# Patient Record
Sex: Female | Born: 1955 | Hispanic: Yes | Marital: Married | Smoking: Never smoker
Health system: Southern US, Community
[De-identification: ages and names within clinical notes are randomized; demographics above are authoritative.]

## PROBLEM LIST (undated history)

## (undated) DIAGNOSIS — Z8673 Personal history of transient ischemic attack (TIA), and cerebral infarction without residual deficits: Secondary | ICD-10-CM

## (undated) DIAGNOSIS — K7460 Unspecified cirrhosis of liver: Secondary | ICD-10-CM

## (undated) DIAGNOSIS — M1990 Unspecified osteoarthritis, unspecified site: Secondary | ICD-10-CM

## (undated) DIAGNOSIS — R161 Splenomegaly, not elsewhere classified: Secondary | ICD-10-CM

## (undated) HISTORY — DX: Unspecified osteoarthritis, unspecified site: M19.90

## (undated) HISTORY — PX: ABDOMINAL HYSTERECTOMY: SHX81

## (undated) HISTORY — DX: Unspecified cirrhosis of liver: K74.60

## (undated) HISTORY — DX: Personal history of transient ischemic attack (TIA), and cerebral infarction without residual deficits: Z86.73

## (undated) HISTORY — PX: APPENDECTOMY: SHX54

## (undated) HISTORY — DX: Splenomegaly, not elsewhere classified: R16.1

## (undated) HISTORY — PX: EXTERNAL EAR SURGERY: SHX627

---

## 2014-04-08 ENCOUNTER — Inpatient Hospital Stay (HOSPITAL_COMMUNITY)
Admission: EM | Admit: 2014-04-08 | Discharge: 2014-04-11 | DRG: 872 | Disposition: A | Discharge status: Home / Self Care | Payer: Self-pay | Attending: Internal Medicine | Admitting: Internal Medicine

## 2014-04-08 ENCOUNTER — Encounter (HOSPITAL_COMMUNITY): Payer: Self-pay | Admitting: Emergency Medicine

## 2014-04-08 ENCOUNTER — Emergency Department (HOSPITAL_COMMUNITY): Payer: PRIVATE HEALTH INSURANCE

## 2014-04-08 DIAGNOSIS — R509 Fever, unspecified: Secondary | ICD-10-CM | POA: Diagnosis present

## 2014-04-08 DIAGNOSIS — R748 Abnormal levels of other serum enzymes: Secondary | ICD-10-CM | POA: Diagnosis present

## 2014-04-08 DIAGNOSIS — A419 Sepsis, unspecified organism: Principal | ICD-10-CM | POA: Diagnosis present

## 2014-04-08 DIAGNOSIS — I69998 Other sequelae following unspecified cerebrovascular disease: Secondary | ICD-10-CM

## 2014-04-08 DIAGNOSIS — R51 Headache: Secondary | ICD-10-CM | POA: Diagnosis present

## 2014-04-08 DIAGNOSIS — N6320 Unspecified lump in the left breast, unspecified quadrant: Secondary | ICD-10-CM

## 2014-04-08 DIAGNOSIS — N63 Unspecified lump in unspecified breast: Secondary | ICD-10-CM | POA: Diagnosis present

## 2014-04-08 DIAGNOSIS — D696 Thrombocytopenia, unspecified: Secondary | ICD-10-CM | POA: Diagnosis present

## 2014-04-08 DIAGNOSIS — G8190 Hemiplegia, unspecified affecting unspecified side: Secondary | ICD-10-CM | POA: Diagnosis present

## 2014-04-08 DIAGNOSIS — K7460 Unspecified cirrhosis of liver: Secondary | ICD-10-CM | POA: Diagnosis present

## 2014-04-08 DIAGNOSIS — N10 Acute tubulo-interstitial nephritis: Secondary | ICD-10-CM | POA: Diagnosis present

## 2014-04-08 DIAGNOSIS — Z91013 Allergy to seafood: Secondary | ICD-10-CM

## 2014-04-08 DIAGNOSIS — R519 Headache, unspecified: Secondary | ICD-10-CM | POA: Diagnosis present

## 2014-04-08 DIAGNOSIS — Z6841 Body Mass Index (BMI) 40.0 and over, adult: Secondary | ICD-10-CM

## 2014-04-08 LAB — URINALYSIS, ROUTINE W REFLEX MICROSCOPIC
Bilirubin Urine: NEGATIVE
Glucose, UA: NEGATIVE mg/dL
Ketones, ur: NEGATIVE mg/dL
Leukocytes, UA: NEGATIVE
NITRITE: NEGATIVE
SPECIFIC GRAVITY, URINE: 1.01 (ref 1.005–1.030)
Urobilinogen, UA: 2 mg/dL — ABNORMAL HIGH (ref 0.0–1.0)
pH: 7.5 (ref 5.0–8.0)

## 2014-04-08 LAB — TROPONIN I

## 2014-04-08 LAB — CBC WITH DIFFERENTIAL/PLATELET
Basophils Absolute: 0 10*3/uL (ref 0.0–0.1)
Basophils Relative: 0 % (ref 0–1)
EOS ABS: 0.1 10*3/uL (ref 0.0–0.7)
Eosinophils Relative: 1 % (ref 0–5)
HEMATOCRIT: 39.2 % (ref 36.0–46.0)
HEMOGLOBIN: 13.4 g/dL (ref 12.0–15.0)
LYMPHS ABS: 1.2 10*3/uL (ref 0.7–4.0)
Lymphocytes Relative: 6 % — ABNORMAL LOW (ref 12–46)
MCH: 34.9 pg — AB (ref 26.0–34.0)
MCHC: 34.2 g/dL (ref 30.0–36.0)
MCV: 102.1 fL — ABNORMAL HIGH (ref 78.0–100.0)
MONOS PCT: 9 % (ref 3–12)
Monocytes Absolute: 1.7 10*3/uL — ABNORMAL HIGH (ref 0.1–1.0)
NEUTROS PCT: 84 % — AB (ref 43–77)
Neutro Abs: 16.4 10*3/uL — ABNORMAL HIGH (ref 1.7–7.7)
Platelets: 92 10*3/uL — ABNORMAL LOW (ref 150–400)
RBC: 3.84 MIL/uL — AB (ref 3.87–5.11)
RDW: 15 % (ref 11.5–15.5)
WBC: 19.4 10*3/uL — ABNORMAL HIGH (ref 4.0–10.5)

## 2014-04-08 LAB — COMPREHENSIVE METABOLIC PANEL
ALBUMIN: 2.2 g/dL — AB (ref 3.5–5.2)
ALT: 40 U/L — AB (ref 0–35)
AST: 45 U/L — ABNORMAL HIGH (ref 0–37)
Alkaline Phosphatase: 162 U/L — ABNORMAL HIGH (ref 39–117)
Anion gap: 11 (ref 5–15)
BILIRUBIN TOTAL: 1.3 mg/dL — AB (ref 0.3–1.2)
BUN: 13 mg/dL (ref 6–23)
CO2: 20 mEq/L (ref 19–32)
Calcium: 9.7 mg/dL (ref 8.4–10.5)
Chloride: 107 mEq/L (ref 96–112)
Creatinine, Ser: 0.69 mg/dL (ref 0.50–1.10)
GFR calc Af Amer: >90 mL/min (ref >90)
GFR calc non Af Amer: >90 mL/min (ref >90)
GLUCOSE: 95 mg/dL (ref 70–99)
POTASSIUM: 4.3 meq/L (ref 3.7–5.3)
SODIUM: 138 meq/L (ref 137–147)
TOTAL PROTEIN: 7.4 g/dL (ref 6.0–8.3)

## 2014-04-08 LAB — URINE MICROSCOPIC-ADD ON

## 2014-04-08 LAB — LACTIC ACID, PLASMA: Lactic Acid, Venous: 1.8 mmol/L (ref 0.5–2.2)

## 2014-04-08 LAB — PRO B NATRIURETIC PEPTIDE: Pro B Natriuretic peptide (BNP): 618.2 pg/mL — ABNORMAL HIGH (ref 0–125)

## 2014-04-08 MED ORDER — ACETAMINOPHEN 500 MG PO TABS
1000.0000 mg | ORAL_TABLET | Freq: Once | ORAL | Status: AC
  Administered 2014-04-08: 1000 mg via ORAL
  Filled 2014-04-08: qty 2

## 2014-04-08 MED ORDER — CEFTRIAXONE SODIUM 1 G IJ SOLR
1.0000 g | Freq: Once | INTRAMUSCULAR | Status: AC
  Administered 2014-04-08: 1 g via INTRAVENOUS
  Filled 2014-04-08: qty 10

## 2014-04-08 MED ORDER — SODIUM CHLORIDE 0.9 % IV SOLN
Freq: Once | INTRAVENOUS | Status: AC
  Administered 2014-04-08: 22:00:00 via INTRAVENOUS

## 2014-04-08 MED ORDER — AZITHROMYCIN 250 MG PO TABS
500.0000 mg | ORAL_TABLET | Freq: Once | ORAL | Status: AC
  Administered 2014-04-08: 500 mg via ORAL
  Filled 2014-04-08: qty 2

## 2014-04-08 NOTE — ED Provider Notes (Signed)
CSN: [id number]     Arrival date & time 04/08/14  2138 History  This chart was scribed for Maudry Diego, MD by Roxan Diesel, ED scribe.  This patient was seen in room APA11/APA11 and the patient's care was started at 9:54 PM.   Chief Complaint  Patient presents with  . Shortness of Breath    Patient is a 58 y.o. female presenting with shortness of breath. The history is provided by a relative. No language interpreter was used.  Shortness of Breath Severity:  Moderate Duration:  1 day Timing:  Constant Chronicity:  New Ineffective treatments:  None tried Associated symptoms: fever and headaches   Associated symptoms: no abdominal pain, no chest pain, no cough and no rash     HPI Comments: Dawn Beard is a 58 y.o. female who presents to the Emergency Department complaining of SOB that began earlier today.  Pt's son states that she has had a fever for the past 2 days and today has had difficulty breathing and "body shaking."  Currently she also complains of a headache.  Pt had a stroke 2 months ago in [place] with residual right-sided weakness.  However she is able to weak.  Family brought her here 1 week ago to receive medical care.  She has an appointment to establish care with a doctor later this month but does not currently have a PCP.    Past Medical History  Diagnosis Date  . Stroke     History reviewed. No pertinent past surgical history.  History reviewed. No pertinent family history.   History  Substance Use Topics  . Smoking status: Never Smoker   . Smokeless tobacco: Never Used  . Alcohol Use: Not on file    OB History   Grav Para Term Preterm Abortions TAB SAB Ect Mult Living                   Review of Systems  Constitutional: Positive for fever. Negative for appetite change and fatigue.  HENT: Negative for congestion, ear discharge and sinus pressure.   Eyes: Negative for discharge.  Respiratory: Positive for shortness of breath. Negative  for cough.   Cardiovascular: Negative for chest pain.  Gastrointestinal: Negative for abdominal pain and diarrhea.  Genitourinary: Negative for frequency and hematuria.  Musculoskeletal: Negative for back pain.  Skin: Negative for rash.  Neurological: Positive for tremors ("body shaking" per son) and headaches. Negative for seizures.  Psychiatric/Behavioral: Negative for hallucinations.      Allergies  Shrimp  Home Medications   Prior to Admission medications   Not on File   BP 140/96  Pulse 122  Temp(Src) 103 F (39.4 C) (Oral)  Resp 24  Ht 5\' 2"  (1.575 m)  Wt 220 lb 7.4 oz (100.001 kg)  BMI 40.31 kg/m2  SpO2 98%  Physical Exam  Nursing note and vitals reviewed. Constitutional: She is oriented to person, place, and time. She appears well-developed.  HENT:  Head: Normocephalic.  Eyes: Conjunctivae and EOM are normal. No scleral icterus.  Neck: Neck supple. No thyromegaly present.  Cardiovascular: Regular rhythm.  Tachycardia present.  Exam reveals no gallop and no friction rub.   No murmur heard. Pulmonary/Chest: No stridor. She has wheezes (bilaterally). She has no rales. She exhibits no tenderness.  Abdominal: She exhibits no distension. There is no tenderness. There is no rebound.  Musculoskeletal: Normal range of motion. She exhibits no edema.  Lymphadenopathy:    She has no cervical adenopathy.  Neurological:  She is oriented to person, place, and time.  Mild weakness to right upper and lower extremities  Skin: No rash noted. No erythema.  Psychiatric: She has a normal mood and affect. Her behavior is normal.    ED Course  Procedures (including critical care time)  DIAGNOSTIC STUDIES: Oxygen Saturation is 98% on room air, normal by my interpretation.    COORDINATION OF CARE: 9:57 PM-Discussed treatment plan which includes Tylenol, CXR, EKG, and labs with pt's family at bedside and they agreed to plan.     Labs Review Labs Reviewed  CULTURE, BLOOD  (ROUTINE X 2)  CULTURE, BLOOD (ROUTINE X 2)  CBC WITH DIFFERENTIAL  COMPREHENSIVE METABOLIC PANEL  LACTIC ACID, PLASMA  PRO B NATRIURETIC PEPTIDE  TROPONIN I  URINALYSIS, ROUTINE W REFLEX MICROSCOPIC    Imaging Review No results found.   EKG Interpretation None      MDM   Final diagnoses:  None   Admit for fever  The chart was scribed for me under my direct supervision.  I personally performed the history, physical, and medical decision making and all procedures in the evaluation of this patient.Maudry Diego, MD 04/08/14 [phone]

## 2014-04-08 NOTE — ED Notes (Signed)
Short of breath starting today, temp elevated x 2 days. Stroke 2 months ago with no residual effects. Does not speak english. Sons translating for her.

## 2014-04-09 ENCOUNTER — Inpatient Hospital Stay (HOSPITAL_COMMUNITY): Payer: PRIVATE HEALTH INSURANCE

## 2014-04-09 DIAGNOSIS — R519 Headache, unspecified: Secondary | ICD-10-CM | POA: Diagnosis present

## 2014-04-09 DIAGNOSIS — N10 Acute tubulo-interstitial nephritis: Secondary | ICD-10-CM

## 2014-04-09 DIAGNOSIS — R509 Fever, unspecified: Secondary | ICD-10-CM

## 2014-04-09 DIAGNOSIS — D696 Thrombocytopenia, unspecified: Secondary | ICD-10-CM

## 2014-04-09 DIAGNOSIS — A419 Sepsis, unspecified organism: Principal | ICD-10-CM

## 2014-04-09 DIAGNOSIS — R51 Headache: Secondary | ICD-10-CM

## 2014-04-09 DIAGNOSIS — K7460 Unspecified cirrhosis of liver: Secondary | ICD-10-CM

## 2014-04-09 DIAGNOSIS — N6320 Unspecified lump in the left breast, unspecified quadrant: Secondary | ICD-10-CM | POA: Diagnosis present

## 2014-04-09 LAB — COMPREHENSIVE METABOLIC PANEL
ALBUMIN: 1.6 g/dL — AB (ref 3.5–5.2)
ALT: 28 U/L (ref 0–35)
AST: 32 U/L (ref 0–37)
Alkaline Phosphatase: 125 U/L — ABNORMAL HIGH (ref 39–117)
Anion gap: 7 (ref 5–15)
BUN: 14 mg/dL (ref 6–23)
CHLORIDE: 114 meq/L — AB (ref 96–112)
CO2: 20 meq/L (ref 19–32)
CREATININE: 0.67 mg/dL (ref 0.50–1.10)
Calcium: 8.6 mg/dL (ref 8.4–10.5)
GFR calc Af Amer: >90 mL/min (ref >90)
Glucose, Bld: 133 mg/dL — ABNORMAL HIGH (ref 70–99)
Potassium: 4 mEq/L (ref 3.7–5.3)
SODIUM: 141 meq/L (ref 137–147)
Total Bilirubin: 1.2 mg/dL (ref 0.3–1.2)
Total Protein: 5.6 g/dL — ABNORMAL LOW (ref 6.0–8.3)

## 2014-04-09 LAB — CBC
HEMATOCRIT: 35.3 % — AB (ref 36.0–46.0)
Hemoglobin: 11.9 g/dL — ABNORMAL LOW (ref 12.0–15.0)
MCH: 35 pg — AB (ref 26.0–34.0)
MCHC: 33.7 g/dL (ref 30.0–36.0)
MCV: 103.8 fL — AB (ref 78.0–100.0)
Platelets: 87 10*3/uL — ABNORMAL LOW (ref 150–400)
RBC: 3.4 MIL/uL — ABNORMAL LOW (ref 3.87–5.11)
RDW: 15 % (ref 11.5–15.5)
WBC: 19 10*3/uL — AB (ref 4.0–10.5)

## 2014-04-09 LAB — HIV ANTIBODY (ROUTINE TESTING W REFLEX): HIV: NONREACTIVE

## 2014-04-09 LAB — PROTIME-INR
INR: 1.53 — ABNORMAL HIGH (ref 0.00–1.49)
Prothrombin Time: 18.4 seconds — ABNORMAL HIGH (ref 11.6–15.2)

## 2014-04-09 LAB — TROPONIN I
Troponin I: <0.3 ng/mL (ref <0.30)
Troponin I: <0.3 ng/mL (ref <0.30)

## 2014-04-09 LAB — VITAMIN B12: VITAMIN B 12: 1942 pg/mL — AB (ref 211–911)

## 2014-04-09 LAB — FOLATE: FOLATE: 7.1 ng/mL

## 2014-04-09 MED ORDER — VANCOMYCIN HCL IN DEXTROSE 1-5 GM/200ML-% IV SOLN
1000.0000 mg | Freq: Once | INTRAVENOUS | Status: AC
  Administered 2014-04-09: 1000 mg via INTRAVENOUS
  Filled 2014-04-09: qty 200

## 2014-04-09 MED ORDER — PIPERACILLIN-TAZOBACTAM 3.375 G IVPB
3.3750 g | Freq: Three times a day (TID) | INTRAVENOUS | Status: DC
  Administered 2014-04-09 – 2014-04-10 (×5): 3.375 g via INTRAVENOUS
  Filled 2014-04-09 (×6): qty 50

## 2014-04-09 MED ORDER — SODIUM CHLORIDE 0.9 % IV SOLN
INTRAVENOUS | Status: DC
  Administered 2014-04-09 – 2014-04-11 (×3): via INTRAVENOUS

## 2014-04-09 MED ORDER — ACETAMINOPHEN 650 MG RE SUPP: 650.0000 mg | Freq: Four times a day (QID) | RECTAL | Status: DC | PRN

## 2014-04-09 MED ORDER — IOHEXOL 300 MG/ML  SOLN
100.0000 mL | Freq: Once | INTRAMUSCULAR | Status: AC | PRN
  Administered 2014-04-09: 100 mL via INTRAVENOUS

## 2014-04-09 MED ORDER — PIPERACILLIN-TAZOBACTAM 3.375 G IVPB
INTRAVENOUS | Status: AC
  Filled 2014-04-09: qty 50

## 2014-04-09 MED ORDER — ONDANSETRON HCL 4 MG/2ML IJ SOLN: 4.0000 mg | Freq: Four times a day (QID) | INTRAMUSCULAR | Status: DC | PRN

## 2014-04-09 MED ORDER — ALBUTEROL SULFATE (2.5 MG/3ML) 0.083% IN NEBU: 2.5000 mg | INHALATION_SOLUTION | RESPIRATORY_TRACT | Status: DC | PRN

## 2014-04-09 MED ORDER — SODIUM CHLORIDE 0.9 % IJ SOLN
3.0000 mL | Freq: Two times a day (BID) | INTRAMUSCULAR | Status: DC
  Administered 2014-04-10: 3 mL via INTRAVENOUS

## 2014-04-09 MED ORDER — ONDANSETRON HCL 4 MG PO TABS: 4.0000 mg | ORAL_TABLET | Freq: Four times a day (QID) | ORAL | Status: DC | PRN

## 2014-04-09 MED ORDER — ACETAMINOPHEN 325 MG PO TABS
650.0000 mg | ORAL_TABLET | Freq: Four times a day (QID) | ORAL | Status: DC | PRN
  Administered 2014-04-09 – 2014-04-10 (×4): 650 mg via ORAL
  Filled 2014-04-09 (×4): qty 2

## 2014-04-09 MED ORDER — VANCOMYCIN HCL IN DEXTROSE 750-5 MG/150ML-% IV SOLN
750.0000 mg | Freq: Two times a day (BID) | INTRAVENOUS | Status: DC
  Administered 2014-04-09 – 2014-04-10 (×2): 750 mg via INTRAVENOUS
  Filled 2014-04-09 (×2): qty 150

## 2014-04-09 MED ORDER — VANCOMYCIN HCL IN DEXTROSE 1-5 GM/200ML-% IV SOLN
INTRAVENOUS | Status: AC
  Filled 2014-04-09: qty 200

## 2014-04-09 NOTE — Care Management Utilization Note (Signed)
UR completed 

## 2014-04-09 NOTE — Progress Notes (Signed)
[facility] PROGRESS NOTE  Dawn Beard UXN:[id number] DOB: 1956/03/21 DOA: 04/08/2014 PCP: No PCP Per Patient  Summary 58 yo woman from [place] 3 days ago with hx stroke (2 months ago) presents with persistent fever, flank pain, chills, and headache. Evaluation reveals pyelonephritis, cirrhosis and small left breast mass  Assessment/Plan: Sepsis secondary to pyelonephritis per CT. Some improvement this am. Her lactic acid is in normal range. Blood pressure slightly soft but tachycardia resolved. Await blood cultures. Continue IV fluids as well as Vanc and Zosy day #1. Monitor closely  Cirrhosis: denies hx of ETOH. INR 1.5. Elevated enzymes.  Will obtain Hep panel. May need GI consult depending on results of Hep panel. If not inpatient then certainly OP GI input.   Thrombocytopenia. Likely related to #2. Await B12 and folate. Home meds include supplements. No s/sx bleeding.   Headache. Resolved.    Elevated liver enzymes. See #2  Hx stroke: mild left sided weakness resolved. Not on aspirin likely due to #3.  Home meds include tegretol. Will continue. No hx seizure  Left breast mass: per CT. OP mamogram   Code Status: full Family Communication: son interpreting at bedside Disposition Plan: home with son   Consultants:  none  Procedures:  none  Antibiotics:  Vancomycin 04/09/14>>  Zosyn 04/09/14>>  HPI/Subjective: Awake. Reports some flank pain.   Objective: Filed Vitals:   04/09/14 0546  BP: 93/52  Pulse: 66  Temp: 98 F (36.7 C)  Resp: 15    Intake/Output Summary (Last 24 hours) at 04/09/14 1205 Last data filed at 04/09/14 0800  Gross per 24 hour  Intake      0 ml  Output      0 ml  Net      0 ml   Filed Weights   04/08/14 2139  Weight: 100.001 kg (220 lb 7.4 oz)    Exam:   General:  Obese calm comfortable  Cardiovascular: RRR No MGR No LE edema  Respiratory: normal effort BS distant but clear bilaterally no wheeze  Abdomen: obese  soft +BS non-tender to palpation  Musculoskeletal: no clubbing or cyanosis   Data Reviewed: Basic Metabolic Panel:  Recent Labs Lab 04/08/14 2220 04/09/14 0514  NA 138 141  K 4.3 4.0  CL 107 114*  CO2 20 20  GLUCOSE 95 133*  BUN 13 14  CREATININE 0.69 0.67  CALCIUM 9.7 8.6   Liver Function Tests:  Recent Labs Lab 04/08/14 2220 04/09/14 0514  AST 45* 32  ALT 40* 28  ALKPHOS 162* 125*  BILITOT 1.3* 1.2  PROT 7.4 5.6*  ALBUMIN 2.2* 1.6*   No results found for this basename: LIPASE, AMYLASE,  in the last 168 hours No results found for this basename: AMMONIA,  in the last 168 hours CBC:  Recent Labs Lab 04/08/14 2314 04/09/14 0514  WBC 19.4* 19.0*  NEUTROABS 16.4*  --   HGB 13.4 11.9*  HCT 39.2 35.3*  MCV 102.1* 103.8*  PLT 92* 87*   Cardiac Enzymes:  Recent Labs Lab 04/08/14 2220 04/09/14 0131 04/09/14 0649  TROPONINI <0.30 <0.30 <0.30   BNP (last 3 results)  Recent Labs  04/08/14 2220  PROBNP 618.2*   CBG: No results found for this basename: GLUCAP,  in the last 168 hours  Recent Results (from the past 240 hour(s))  CULTURE, BLOOD (ROUTINE X 2)     Status: None   Collection Time    04/08/14 10:19 PM      Result Value Ref  Range Status   Specimen Description RIGHT ANTECUBITAL   Final   Special Requests BOTTLES DRAWN AEROBIC ONLY 10CC   Final   Culture NO GROWTH 1 DAY   Final   Report Status PENDING   Incomplete  CULTURE, BLOOD (ROUTINE X 2)     Status: None   Collection Time    04/08/14 10:20 PM      Result Value Ref Range Status   Specimen Description BLOOD RIGHT ARM   Final   Special Requests BOTTLES DRAWN AEROBIC AND ANAEROBIC 8CC   Final   Culture NO GROWTH 1 DAY   Final   Report Status PENDING   Incomplete     Studies: Ct Chest W Contrast  04/09/2014   CLINICAL DATA:  Fever.  Sepsis of unclear etiology.  EXAM: CT CHEST, ABDOMEN, AND PELVIS WITH CONTRAST  TECHNIQUE: Multidetector CT imaging of the chest, abdomen and pelvis was  performed following the standard protocol during bolus administration of intravenous contrast.  CONTRAST:  132mL OMNIPAQUE IOHEXOL 300 MG/ML  SOLN  COMPARISON:  None.  FINDINGS: CT CHEST FINDINGS  Cardiac silhouette is borderline enlarged. Great vessels are normal in caliber. No mediastinal or hilar masses or enlarged lymph nodes. No neck base or axillary masses or adenopathy.  There is dependent subsegmental atelectasis most evident in the posterior lower lobes. No lung consolidation or edema. No lung nodule or mass. No effusion or pneumothorax.  There is a 15 mm relatively hyper attenuating mass in the left breast.  CT ABDOMEN AND PELVIS FINDINGS  There is an area of wedge-shaped hypoattenuation from the lower pole of the left kidney, which measures 3.5 cm x 3.2 cm in greatest transverse dimension. There is some perinephric stranding and thickening of the perirenal fascia. No discrete fluid collection is seen to suggest an abscess. Findings support pyelonephritis. No convincing right pyelonephritis. No renal mass. No hydronephrosis. Normal ureters. Unremarkable bladder.  Liver shows morphologic changes consistent with cirrhosis with subtle surface nodularity and central volume loss and relative enlargement of the caudate lobe. No liver mass or focal lesion is seen.  Spleen is enlarged measuring 20 cm by 13 cm x 6.9 cm. No splenic mass or focal lesions.  There are prominent venous collaterals that extend from the splenic hilum to the left renal vein.  There is a trace amount of ascites, noted adjacent to the liver.  Gallbladder and pancreas are unremarkable. No bile duct dilation. No adrenal masses. There are mildly enlarged gastrohepatic ligament lymph nodes, the largest measuring 13.5 mm in short axis. These are nonspecific but presumed reactive, and a common finding in the setting of cirrhosis.  Status post hysterectomy. No pelvic masses. Colon and small bowel are unremarkable. Appendix not visualized. No  abnormal fluid collections are seen to suggest an abscess. Small fat containing umbilical hernia also containing a small amount of fluid attenuation. No bowel.  There are degenerative changes throughout the visualized spine as well as an S-shaped scoliosis. No osteoblastic or osteolytic lesions.  IMPRESSION: 1. Findings consistent with pyelonephritis of the lower pole of the left kidney, likely the source of sepsis. No evidence of an abscess. No other findings to suggest a source of infection. 2. Cirrhosis with portal venous hypertension reflected by splenomegaly and splenorenal vascular collaterals. No liver mass or focal lesion. 3. Trace ascites. 4. Small left breast mass. Recommend correlation with mammography if mammography has not been recently performed. 5. Multiple other chronic findings as detailed.   Electronically Signed   By:  Lajean Manes M.D.   On: 04/09/2014 11:24   Ct Abdomen Pelvis W Contrast  04/09/2014   CLINICAL DATA:  Fever.  Sepsis of unclear etiology.  EXAM: CT CHEST, ABDOMEN, AND PELVIS WITH CONTRAST  TECHNIQUE: Multidetector CT imaging of the chest, abdomen and pelvis was performed following the standard protocol during bolus administration of intravenous contrast.  CONTRAST:  134mL OMNIPAQUE IOHEXOL 300 MG/ML  SOLN  COMPARISON:  None.  FINDINGS: CT CHEST FINDINGS  Cardiac silhouette is borderline enlarged. Great vessels are normal in caliber. No mediastinal or hilar masses or enlarged lymph nodes. No neck base or axillary masses or adenopathy.  There is dependent subsegmental atelectasis most evident in the posterior lower lobes. No lung consolidation or edema. No lung nodule or mass. No effusion or pneumothorax.  There is a 15 mm relatively hyper attenuating mass in the left breast.  CT ABDOMEN AND PELVIS FINDINGS  There is an area of wedge-shaped hypoattenuation from the lower pole of the left kidney, which measures 3.5 cm x 3.2 cm in greatest transverse dimension. There is some  perinephric stranding and thickening of the perirenal fascia. No discrete fluid collection is seen to suggest an abscess. Findings support pyelonephritis. No convincing right pyelonephritis. No renal mass. No hydronephrosis. Normal ureters. Unremarkable bladder.  Liver shows morphologic changes consistent with cirrhosis with subtle surface nodularity and central volume loss and relative enlargement of the caudate lobe. No liver mass or focal lesion is seen.  Spleen is enlarged measuring 20 cm by 13 cm x 6.9 cm. No splenic mass or focal lesions.  There are prominent venous collaterals that extend from the splenic hilum to the left renal vein.  There is a trace amount of ascites, noted adjacent to the liver.  Gallbladder and pancreas are unremarkable. No bile duct dilation. No adrenal masses. There are mildly enlarged gastrohepatic ligament lymph nodes, the largest measuring 13.5 mm in short axis. These are nonspecific but presumed reactive, and a common finding in the setting of cirrhosis.  Status post hysterectomy. No pelvic masses. Colon and small bowel are unremarkable. Appendix not visualized. No abnormal fluid collections are seen to suggest an abscess. Small fat containing umbilical hernia also containing a small amount of fluid attenuation. No bowel.  There are degenerative changes throughout the visualized spine as well as an S-shaped scoliosis. No osteoblastic or osteolytic lesions.  IMPRESSION: 1. Findings consistent with pyelonephritis of the lower pole of the left kidney, likely the source of sepsis. No evidence of an abscess. No other findings to suggest a source of infection. 2. Cirrhosis with portal venous hypertension reflected by splenomegaly and splenorenal vascular collaterals. No liver mass or focal lesion. 3. Trace ascites. 4. Small left breast mass. Recommend correlation with mammography if mammography has not been recently performed. 5. Multiple other chronic findings as detailed.    Electronically Signed   By: Lajean Manes M.D.   On: 04/09/2014 11:24   Dg Chest Portable 1 View  04/08/2014   CLINICAL DATA:  Shortness of Breath  EXAM: PORTABLE CHEST - 1 VIEW  COMPARISON:  None.  FINDINGS: Lungs are clear. Heart is upper normal in size with normal pulmonary vascularity. No adenopathy. No bone lesions.  IMPRESSION: No edema or consolidation.   Electronically Signed   By: Lowella Grip M.D.   On: 04/08/2014 22:43    Scheduled Meds: . piperacillin-tazobactam (ZOSYN)  IV  3.375 g Intravenous Q8H  . sodium chloride  3 mL Intravenous Q12H  . vancomycin  750 mg Intravenous Q12H   Continuous Infusions: . sodium chloride 100 mL/hr at 04/09/14 4665    Principal Problem:   Sepsis Active Problems:   Fever   Thrombocytopenia   Headache   Morbid obesity   Acute pyelonephritis   Cirrhosis   Breast mass, left    Time spent: 35 minutes    [facility] Pager [phone]. If 7PM-7AM, please contact night-coverage at [email or web address], password [facility] 04/09/2014, 12:05 PM  LOS: 1 day

## 2014-04-09 NOTE — H&P (Signed)
[facility] History and Physical  Dawn Beard QMV:[id number] DOB: 05/23/1956 DOA: 04/08/2014  Referring physician: Dr. Roderic Palau PCP: No PCP Per Patient   Chief Complaint: fever  HPI: Dawn Beard is a 58 y.o. female who recently moved to the [place] from [place] approximately 3-4 days ago. The patient does not speak any [place]. Her sons are translating during my visit. Patient is somewhat of a poor historian. Initially, he was reported the patient was having fever, shortness of breath and headache for the past 3 days. On further questioning, her son reports that she has not been feeling well for several weeks now. She usually has fevers during the evening/night. She has associated headache in her temporal regions bilaterally. No recent changes in vision. She's not had any diarrhea her on antibiotics. She's not had any dysuria. She's had no cough, hemoptysis, weight loss. No chest pain. She denies any abdominal pain or vomiting. She does not have any sore throat. She denies any neck stiffness/pain. Her family reports that she usually does well during the day and feels poorly at night. They report that she had seen several doctors in [place] without any definitive diagnosis and therefore her family brought her to the [place] for further treatment. There's been no development of recent rashes. Her family reports that she did have a stroke approximately 2 months ago which resulted in some left sided hemiparesis. Her neurologic deficits have improved since onset. The patient is able to stand and cook without difficulty. She does not smoke or consume alcohol. Workup in the emergency room his unremarkable. Urinalysis is unrevealing as is chest x-ray. She is noted to have a high fever 103.4 with a significant leukocytosis and thrombocytopenia. Liver enzymes are mildly elevated. The patient will be admitted for further workup.   Review of Systems:  Limited due to language  barrier. Pertinent positives as per HPI, otherwise negative  Past Medical History  Diagnosis Date  . Stroke    History reviewed. No pertinent past surgical history. Social History:  reports that she has never smoked. She has never used smokeless tobacco. Her alcohol and drug histories are not on file.  Allergies  Allergen Reactions  . Shrimp [Shellfish Allergy]     History reviewed. No pertinent family history.   Prior to Admission medications   Not on File   Physical Exam: Filed Vitals:   04/08/14 2347  BP:   Pulse:   Temp: 101.3 F (38.5 C)  Resp:     BP 110/63  Pulse 107  Temp(Src) 101.3 F (38.5 C) (Oral)  Resp 27  Ht 5\' 2"  (1.575 m)  Wt 100.001 kg (220 lb 7.4 oz)  BMI 40.31 kg/m2  SpO2 98%  General:  Appears calm and comfortable Eyes: PERRL, normal lids, irises & conjunctiva ENT: grossly normal hearing, lips & tongue Neck: no LAD, masses or thyromegaly, no meningismus Cardiovascular: tachycardic, regular, no m/r/g. No LE edema. Telemetry: SR, no arrhythmias  Respiratory: CTA bilaterally, no w/r/r. Normal respiratory effort. Abdomen: soft, obese, diffusely tender, bs+ Skin: no rash or induration seen on limited exam Musculoskeletal: grossly normal tone BUE/BLE Psychiatric: grossly normal mood and affect, does not speak english Neurologic: grossly non-focal.          Labs on Admission:  Basic Metabolic Panel:  Recent Labs Lab 04/08/14 2220  NA 138  K 4.3  CL 107  CO2 20  GLUCOSE 95  BUN 13  CREATININE 0.69  CALCIUM 9.7   Liver Function Tests:  Recent Labs Lab 04/08/14 2220  AST 45*  ALT 40*  ALKPHOS 162*  BILITOT 1.3*  PROT 7.4  ALBUMIN 2.2*   No results found for this basename: LIPASE, AMYLASE,  in the last 168 hours No results found for this basename: AMMONIA,  in the last 168 hours CBC:  Recent Labs Lab 04/08/14 2314  WBC 19.4*  NEUTROABS 16.4*  HGB 13.4  HCT 39.2  MCV 102.1*  PLT 92*   Cardiac Enzymes:  Recent  Labs Lab 04/08/14 2220  TROPONINI <0.30    BNP (last 3 results)  Recent Labs  04/08/14 2220  PROBNP 618.2*   CBG: No results found for this basename: GLUCAP,  in the last 168 hours  Radiological Exams on Admission: Dg Chest Portable 1 View  04/08/2014   CLINICAL DATA:  Shortness of Breath  EXAM: PORTABLE CHEST - 1 VIEW  COMPARISON:  None.  FINDINGS: Lungs are clear. Heart is upper normal in size with normal pulmonary vascularity. No adenopathy. No bone lesions.  IMPRESSION: No edema or consolidation.   Electronically Signed   By: Lowella Grip M.D.   On: 04/08/2014 22:43    Assessment/Plan Active Problems:   Fever   Thrombocytopenia   Sepsis   Headache   Morbid obesity   1. Sepsis. Etiology is not entirely clear at this time. Her lactic acid is in normal range this is reassuring. Blood pressure is also normal range. She does not have any signs of meningismus. She does not have any significant cough/hemoptysis or weight loss. Urinalysis and chest x-ray have been unrevealing. We'll check HIV antibody. We'll also check a CT of the chest abdomen and pelvis to rule out any occult infections. If all this workup is still unrevealing, would consider discussing case with infectious disease in the morning. We'll start the patient empirically on antibiotics at this time. Blood cultures have been sent. 2. Thrombocytopenia. Possibly related to underlying sepsis. Baseline platelet count is unknown. Check B12 and folate. Continue to follow 3. Headache. She does not appear to have any new neurologic deficits at this time. If workup is unrevealing, may need to consider further imaging of brain versus lumbar puncture. 4. Elevated liver enzymes. Possibly reactive to underlying systemic condition. We'll further evaluate with imaging. Check INR.  Code Status: full code Family Communication: discussed with sons at the bedside Disposition Plan: discharge home when clinically improved  Time spent:  3mins  MEMON,JEHANZEB [facility] Pager [phone]  **Disclaimer: This note may have been dictated with voice recognition software. Similar sounding words can inadvertently be transcribed and this note may contain transcription errors which may not have been corrected upon publication of note.**

## 2014-04-09 NOTE — Progress Notes (Signed)
Patient seen, independently examined and chart reviewed. I agree with exam, assessment and plan discussed with Dyanne Carrel, NP.  58 year old woman from [place], brought here in the last 48 hours by her son for further evaluation of subacute fever without a source. Other than her stroke several months ago the patient been in good health with no particular medical problems noted. She has no history of alcohol use.   Fever was confirmed an initial investigation unrevealing. Subsequent CT imaging revealed pyelonephritis.  PMH STROKE   Subjective: She complains of a frontal headache which has been present for some time. She has no nausea, vomiting or abdominal pain. No dysuria. She does have someleft-sided flank pain.  Objective: temperature 103.4 maximum. Vitals stable. No hypoxia.   Gen.  appears calm and comfortable.  Cardiovascular.  regular rate and rhythm. No murmur, rub or gallop. No lower extremity edema.  Respiratory.  clear to auscultation bilaterally. No wheezes, rales or rhonchi. Normal respiratory effort  Abdomen.  soft, nontender, nondistended  Skin.  grossly unremarkable  Musculoskeletal.  grossly unremarkable  Neurologic.  nonfocal   Chemistry:  complete metabolic panel notable for modest elevation of alkaline phosphatase, AST, ALT on admission. AST and ALT normalized today. Troponins negative.   Heme: WBC 19.0. Platelet count 87. INR 1.53.  ID:  blood cultures and urine culture pending  Other:   Imaging:  imaging consistent with left sided polynephritis which correlates with her symptoms. Also revealed cirrhosis and small left breast mass.  58 year old woman with a several week history of fever, modest left flank pain and frontal headache. Her history, mild flank pain on palpation and imaging findings suggest: Pyelonephritis as a source although her urinalysis is not terribly impressive. Followup urine culture and blood cultures and monitor for recurrent  fever.  She has no history of alcohol use, etiology of cirrhosis unclear, followup hepatitis panel. Hepatitis panel is negative, consider inpatient GI consultation for evaluation of less common etiologies. She will certainly need outpatient followup for cirrhosis care.  Suggest mammogram as an outpatient followup breast mass.  Murray Hodgkins, MD [facility] [phone]

## 2014-04-09 NOTE — Progress Notes (Signed)
ANTIBIOTIC CONSULT NOTE - INITIAL  Pharmacy Consult for vancomycin & Zosyn Indication: R/O sepsis  Allergies  Allergen Reactions  . Shrimp [Shellfish Allergy]     Patient Measurements: Height: 5\' 2"  (157.5 cm) Weight: 220 lb 7.4 oz (100.001 kg) IBW/kg (Calculated) : 50.1 Adjusted Body Weight: 67kg  Vital Signs: Temp: 100.2 F (37.9 C) (07/13 0038) Temp src: Oral (07/13 0038) BP: 132/100 mmHg (07/13 0038) Pulse Rate: 112 (07/13 0038) Intake/Output from previous day:   Intake/Output from this shift:    Labs:  Recent Labs  04/08/14 2220 04/08/14 2314  WBC  --  19.4*  HGB  --  13.4  PLT  --  92*  CREATININE 0.69  --    Estimated Creatinine Clearance: 84.8 ml/min (by C-G formula based on Cr of 0.69). Patient received one dose each of azithromycin & ceftriaxone at approx 2300 last evening.  Microbiology: No results found for this or any previous visit (from the past 720 hour(s)).  Medical History: Past Medical History  Diagnosis Date  . Stroke     Medications:  Scheduled:  . piperacillin-tazobactam (ZOSYN)  IV  3.375 g Intravenous Q8H  . sodium chloride  3 mL Intravenous Q12H  . vancomycin  1,000 mg Intravenous Once  . vancomycin  750 mg Intravenous Q12H   Infusions:  . sodium chloride     PRN: acetaminophen, acetaminophen, albuterol, ondansetron (ZOFRAN) IV, ondansetron   Assessment: 31yr female morbidly obese with elevated LFT's, low serum albumin, elevated Ca++ (corrected for low albumin), low PLTC, & elevated WBC.  Patient to be started on Zosyn & vancomycin for sepsis coverage.  Goal of Therapy:  Since CrCl>24ml/min, will start standard Zosyn regimen.  Desire vancomycin trough level 15-55mcg/ml.  Expect that patient will not have a normal volume of distribution because of the obesity and low serum albumin (may have interstitial fluid leaking).   Plan:  1. Vancomycin loading dose 1gm IV x 1, then start 2.  Vancomycin 750mg  IV q12h  3.  Monitor  indices of infection and renal function 4.  Get actual steady state serumvancomycin trough level  Wesam Gearhart E 04/09/2014,1:28 AM

## 2014-04-10 LAB — COMPREHENSIVE METABOLIC PANEL
ALT: 26 U/L (ref 0–35)
AST: 29 U/L (ref 0–37)
Albumin: 1.8 g/dL — ABNORMAL LOW (ref 3.5–5.2)
Alkaline Phosphatase: 115 U/L (ref 39–117)
Anion gap: 7 (ref 5–15)
BUN: 9 mg/dL (ref 6–23)
CALCIUM: 8.5 mg/dL (ref 8.4–10.5)
CO2: 22 mEq/L (ref 19–32)
Chloride: 112 mEq/L (ref 96–112)
Creatinine, Ser: 0.76 mg/dL (ref 0.50–1.10)
GFR calc non Af Amer: >90 mL/min (ref >90)
GLUCOSE: 99 mg/dL (ref 70–99)
Potassium: 3.9 mEq/L (ref 3.7–5.3)
SODIUM: 141 meq/L (ref 137–147)
TOTAL PROTEIN: 5.9 g/dL — AB (ref 6.0–8.3)
Total Bilirubin: 1.7 mg/dL — ABNORMAL HIGH (ref 0.3–1.2)

## 2014-04-10 LAB — CBC
HCT: 34.6 % — ABNORMAL LOW (ref 36.0–46.0)
HEMOGLOBIN: 11.9 g/dL — AB (ref 12.0–15.0)
MCH: 35.4 pg — AB (ref 26.0–34.0)
MCHC: 34.4 g/dL (ref 30.0–36.0)
MCV: 103 fL — ABNORMAL HIGH (ref 78.0–100.0)
Platelets: 90 10*3/uL — ABNORMAL LOW (ref 150–400)
RBC: 3.36 MIL/uL — ABNORMAL LOW (ref 3.87–5.11)
RDW: 15 % (ref 11.5–15.5)
WBC: 10.2 10*3/uL (ref 4.0–10.5)

## 2014-04-10 LAB — HEPATITIS PANEL, ACUTE
HCV Ab: NEGATIVE
Hep A IgM: NONREACTIVE
Hep B C IgM: NONREACTIVE
Hepatitis B Surface Ag: NEGATIVE

## 2014-04-10 MED ORDER — DEXTROSE 5 % IV SOLN
1.0000 g | INTRAVENOUS | Status: DC
  Administered 2014-04-10: 1 g via INTRAVENOUS
  Filled 2014-04-10 (×3): qty 10

## 2014-04-10 NOTE — Progress Notes (Addendum)
I have seen and examined this patient and I agree with the above assessment and plan.  Sepsis secondary to pyelonephritis - prelim cultures with E coli. She denies history of recurrent UTIs. Switch to Ceftriaxone, cultures pending  Liver cirrhosis - borderline INR elevation, thrombocytopenia, elevated bilirubin. Will be followed up by GI as an outpatient. Hepatitis panel negative. Thrombocytopenia Elevated LFTs Left breast mass - mammogram as an outpatient.   Temperature 100.5 last night, still with CVA tenderness today. IV antibiotics for 24 hours and reassess in am and consider d/c home. Will need close outpatient follow up with local PCP and GI.  Time spent: 35 minutes.  Marzetta Board, MD [facility] [phone]

## 2014-04-10 NOTE — Progress Notes (Signed)
[facility] PROGRESS NOTE  Dawn Beard NKN:[id number] DOB: 03-01-1956 DOA: 04/08/2014 PCP: No PCP Per Patient  Assessment/Plan: Sepsis secondary to pyelonephritis per CT. Resolved. Max temp 100.5. VSS. Blood cultures with no growth to date. Urine culture pending.  Will decrease rate of IV fluids. Will change vanc and zosyn to rocephin.   Cirrhosis: denies hx of ETOH. INR 1.5. Elevated enzymes on admission. AST, ALT and Alk phos within the limits of normal today. Hep panel non-reactive. Total bili trending up slightly 1.7.  She has appointment with GI as outpatient  on 04/25/14.    Thrombocytopenia. Likely related to #2.  B12 1942 and folate 7.1. Home meds include supplements. No s/sx bleeding.   Headache. Resolved.   Elevated liver enzymes. See #2   Hx stroke: mild left sided weakness resolved. Not on aspirin likely due to #3. Home meds include tegretol. Will continue. No hx seizure   Left breast mass: per CT. OP mamogram.     Code Status: full Family Communication: son at bedside serves as interpretor Disposition Plan: home with son hopefully tomorrow   Consultants:  none  Procedures:  none  Antibiotics: Vancomycin 04/09/14>> 04/10/14 Zosyn 04/09/14>>04/10/14 Rocephin 04/10/14>>  HPI/Subjective: Awake, smiling. Denies pain/discomfort. Reports feeling hungry  Objective: Filed Vitals:   04/10/14 1000  BP: 122/54  Pulse: 78  Temp: 98.7 F (37.1 C)  Resp:     Intake/Output Summary (Last 24 hours) at 04/10/14 1054 Last data filed at 04/09/14 1809  Gross per 24 hour  Intake   1905 ml  Output      0 ml  Net   1905 ml   Filed Weights   04/08/14 2139  Weight: 100.001 kg (220 lb 7.4 oz)    Exam:   General:  Well nourished NAD  Cardiovascular: RRR No MGR No LE edema  Respiratory: normal effort BS clear bilaterally no wheeze  Abdomen: obese soft +BS non-tender to palpation  Musculoskeletal: no clubbing or cyanosis   Data Reviewed: Basic  Metabolic Panel:  Recent Labs Lab 04/08/14 2220 04/09/14 0514 04/10/14 0802  NA 138 141 141  K 4.3 4.0 3.9  CL 107 114* 112  CO2 20 20 22   GLUCOSE 95 133* 99  BUN 13 14 9   CREATININE 0.69 0.67 0.76  CALCIUM 9.7 8.6 8.5   Liver Function Tests:  Recent Labs Lab 04/08/14 2220 04/09/14 0514 04/10/14 0802  AST 45* 32 29  ALT 40* 28 26  ALKPHOS 162* 125* 115  BILITOT 1.3* 1.2 1.7*  PROT 7.4 5.6* 5.9*  ALBUMIN 2.2* 1.6* 1.8*   No results found for this basename: LIPASE, AMYLASE,  in the last 168 hours No results found for this basename: AMMONIA,  in the last 168 hours CBC:  Recent Labs Lab 04/08/14 2314 04/09/14 0514 04/10/14 0802  WBC 19.4* 19.0* 10.2  NEUTROABS 16.4*  --   --   HGB 13.4 11.9* 11.9*  HCT 39.2 35.3* 34.6*  MCV 102.1* 103.8* 103.0*  PLT 92* 87* 90*   Cardiac Enzymes:  Recent Labs Lab 04/08/14 2220 04/09/14 0131 04/09/14 0649 04/09/14 1307  TROPONINI <0.30 <0.30 <0.30 <0.30   BNP (last 3 results)  Recent Labs  04/08/14 2220  PROBNP 618.2*   CBG: No results found for this basename: GLUCAP,  in the last 168 hours  Recent Results (from the past 240 hour(s))  CULTURE, BLOOD (ROUTINE X 2)     Status: None   Collection Time    04/08/14 10:19 PM  Result Value Ref Range Status   Specimen Description BLOOD RIGHT ANTECUBITAL   Final   Special Requests BOTTLES DRAWN AEROBIC ONLY 10CC   Final   Culture NO GROWTH 2 DAYS   Final   Report Status PENDING   Incomplete  CULTURE, BLOOD (ROUTINE X 2)     Status: None   Collection Time    04/08/14 10:20 PM      Result Value Ref Range Status   Specimen Description BLOOD RIGHT ARM   Final   Special Requests BOTTLES DRAWN AEROBIC AND ANAEROBIC 8CC   Final   Culture NO GROWTH 2 DAYS   Final   Report Status PENDING   Incomplete     Studies: Ct Chest W Contrast  04/09/2014   CLINICAL DATA:  Fever.  Sepsis of unclear etiology.  EXAM: CT CHEST, ABDOMEN, AND PELVIS WITH CONTRAST  TECHNIQUE:  Multidetector CT imaging of the chest, abdomen and pelvis was performed following the standard protocol during bolus administration of intravenous contrast.  CONTRAST:  146m OMNIPAQUE IOHEXOL 300 MG/ML  SOLN  COMPARISON:  None.  FINDINGS: CT CHEST FINDINGS  Cardiac silhouette is borderline enlarged. Great vessels are normal in caliber. No mediastinal or hilar masses or enlarged lymph nodes. No neck base or axillary masses or adenopathy.  There is dependent subsegmental atelectasis most evident in the posterior lower lobes. No lung consolidation or edema. No lung nodule or mass. No effusion or pneumothorax.  There is a 15 mm relatively hyper attenuating mass in the left breast.  CT ABDOMEN AND PELVIS FINDINGS  There is an area of wedge-shaped hypoattenuation from the lower pole of the left kidney, which measures 3.5 cm x 3.2 cm in greatest transverse dimension. There is some perinephric stranding and thickening of the perirenal fascia. No discrete fluid collection is seen to suggest an abscess. Findings support pyelonephritis. No convincing right pyelonephritis. No renal mass. No hydronephrosis. Normal ureters. Unremarkable bladder.  Liver shows morphologic changes consistent with cirrhosis with subtle surface nodularity and central volume loss and relative enlargement of the caudate lobe. No liver mass or focal lesion is seen.  Spleen is enlarged measuring 20 cm by 13 cm x 6.9 cm. No splenic mass or focal lesions.  There are prominent venous collaterals that extend from the splenic hilum to the left renal vein.  There is a trace amount of ascites, noted adjacent to the liver.  Gallbladder and pancreas are unremarkable. No bile duct dilation. No adrenal masses. There are mildly enlarged gastrohepatic ligament lymph nodes, the largest measuring 13.5 mm in short axis. These are nonspecific but presumed reactive, and a common finding in the setting of cirrhosis.  Status post hysterectomy. No pelvic masses. Colon and  small bowel are unremarkable. Appendix not visualized. No abnormal fluid collections are seen to suggest an abscess. Small fat containing umbilical hernia also containing a small amount of fluid attenuation. No bowel.  There are degenerative changes throughout the visualized spine as well as an S-shaped scoliosis. No osteoblastic or osteolytic lesions.  IMPRESSION: 1. Findings consistent with pyelonephritis of the lower pole of the left kidney, likely the source of sepsis. No evidence of an abscess. No other findings to suggest a source of infection. 2. Cirrhosis with portal venous hypertension reflected by splenomegaly and splenorenal vascular collaterals. No liver mass or focal lesion. 3. Trace ascites. 4. Small left breast mass. Recommend correlation with mammography if mammography has not been recently performed. 5. Multiple other chronic findings as detailed.   Electronically  Signed   By: Lajean Manes M.D.   On: 04/09/2014 11:24   Ct Abdomen Pelvis W Contrast  04/09/2014   CLINICAL DATA:  Fever.  Sepsis of unclear etiology.  EXAM: CT CHEST, ABDOMEN, AND PELVIS WITH CONTRAST  TECHNIQUE: Multidetector CT imaging of the chest, abdomen and pelvis was performed following the standard protocol during bolus administration of intravenous contrast.  CONTRAST:  131m OMNIPAQUE IOHEXOL 300 MG/ML  SOLN  COMPARISON:  None.  FINDINGS: CT CHEST FINDINGS  Cardiac silhouette is borderline enlarged. Great vessels are normal in caliber. No mediastinal or hilar masses or enlarged lymph nodes. No neck base or axillary masses or adenopathy.  There is dependent subsegmental atelectasis most evident in the posterior lower lobes. No lung consolidation or edema. No lung nodule or mass. No effusion or pneumothorax.  There is a 15 mm relatively hyper attenuating mass in the left breast.  CT ABDOMEN AND PELVIS FINDINGS  There is an area of wedge-shaped hypoattenuation from the lower pole of the left kidney, which measures 3.5 cm x 3.2  cm in greatest transverse dimension. There is some perinephric stranding and thickening of the perirenal fascia. No discrete fluid collection is seen to suggest an abscess. Findings support pyelonephritis. No convincing right pyelonephritis. No renal mass. No hydronephrosis. Normal ureters. Unremarkable bladder.  Liver shows morphologic changes consistent with cirrhosis with subtle surface nodularity and central volume loss and relative enlargement of the caudate lobe. No liver mass or focal lesion is seen.  Spleen is enlarged measuring 20 cm by 13 cm x 6.9 cm. No splenic mass or focal lesions.  There are prominent venous collaterals that extend from the splenic hilum to the left renal vein.  There is a trace amount of ascites, noted adjacent to the liver.  Gallbladder and pancreas are unremarkable. No bile duct dilation. No adrenal masses. There are mildly enlarged gastrohepatic ligament lymph nodes, the largest measuring 13.5 mm in short axis. These are nonspecific but presumed reactive, and a common finding in the setting of cirrhosis.  Status post hysterectomy. No pelvic masses. Colon and small bowel are unremarkable. Appendix not visualized. No abnormal fluid collections are seen to suggest an abscess. Small fat containing umbilical hernia also containing a small amount of fluid attenuation. No bowel.  There are degenerative changes throughout the visualized spine as well as an S-shaped scoliosis. No osteoblastic or osteolytic lesions.  IMPRESSION: 1. Findings consistent with pyelonephritis of the lower pole of the left kidney, likely the source of sepsis. No evidence of an abscess. No other findings to suggest a source of infection. 2. Cirrhosis with portal venous hypertension reflected by splenomegaly and splenorenal vascular collaterals. No liver mass or focal lesion. 3. Trace ascites. 4. Small left breast mass. Recommend correlation with mammography if mammography has not been recently performed. 5.  Multiple other chronic findings as detailed.   Electronically Signed   By: DLajean ManesM.D.   On: 04/09/2014 11:24   Dg Chest Portable 1 View  04/08/2014   CLINICAL DATA:  Shortness of Breath  EXAM: PORTABLE CHEST - 1 VIEW  COMPARISON:  None.  FINDINGS: Lungs are clear. Heart is upper normal in size with normal pulmonary vascularity. No adenopathy. No bone lesions.  IMPRESSION: No edema or consolidation.   Electronically Signed   By: WLowella GripM.D.   On: 04/08/2014 22:43    Scheduled Meds: . sodium chloride  3 mL Intravenous Q12H   Continuous Infusions: . sodium chloride 100 mL/hr at 04/09/14  0232    Principal Problem:   Sepsis Active Problems:   Fever   Thrombocytopenia   Headache   Morbid obesity   Acute pyelonephritis   Cirrhosis   Breast mass, left    Time spent: 35 minutes    [facility] Pager [phone]. If 7PM-7AM, please contact night-coverage at [email or web address], password [facility] 04/10/2014, 10:54 AM  LOS: 2 days

## 2014-04-10 NOTE — Care Management Note (Signed)
    Page 1 of 1   04/11/2014     5:14:20 PM CARE MANAGEMENT NOTE 04/11/2014  Patient:  Dawn Beard, Dawn Beard   Account Number:  [id number]  Date Initiated:  04/10/2014  Documentation initiated by:  Vladimir Creeks  Subjective/Objective Assessment:   Admitted with fever and sepsis, cirrhosis.She just came from [place], with her son, and son says family plans for her to stay. She does not speak [place],  but son does. She does not have Beard PCP, and neither does son     Action/Plan:   Son requests CM set pt up with PCP appointment. Dr Cindie Laroche agreed to take the patient, and his office will call the pt in two days, as expect pt to D/C in am   Anticipated DC Date:  04/11/2014   Anticipated DC Plan:  [place]  CM consult  [place] [facility]  PCP issues      Choice offered to / List presented to:             Status of service:  In process, will continue to follow Medicare Important Message given?   (If response is "NO", the following Medicare IM given date fields will be blank) Date Medicare IM given:   Medicare IM given by:   Date Additional Medicare IM given:   Additional Medicare IM given by:    Discharge Disposition:    Per UR Regulation:  Reviewed for med. necessity/level of care/duration of stay  If discussed at [place] of Stay Meetings, dates discussed:    Comments:  04/11/14 [place] RN/CM [place] RN visited pt and she [facility] appt at [facility] and [facility] since son said  they were not sure they could afford Beard regular doctor. She will be following the pt also. Assisted with medications through the [facility] program 04/10/14 [place]

## 2014-04-10 NOTE — Progress Notes (Signed)
ANTIBIOTIC CONSULT NOTE - INITIAL  Pharmacy Consult for Rocephin Indication: pyelonephritis  Allergies  Allergen Reactions  . Shrimp [Shellfish Allergy]    Patient Measurements: Height: 5\' 2"  (157.5 cm) Weight: 220 lb 7.4 oz (100.001 kg) IBW/kg (Calculated) : 50.1 Adjusted Body Weight: 67kg  Vital Signs: Temp: 98.7 F (37.1 C) (07/14 1000) Temp src: Oral (07/14 1000) BP: 122/54 mmHg (07/14 1000) Pulse Rate: 78 (07/14 1000) Intake/Output from previous day: 07/13 0701 - 07/14 0700 In: 1905 [P.O.:240; I.V.:1665] Out: -  Intake/Output from this shift:    Labs:  Recent Labs  04/08/14 2220 04/08/14 2314 04/09/14 0514 04/10/14 0802  WBC  --  19.4* 19.0* 10.2  HGB  --  13.4 11.9* 11.9*  PLT  --  92* 87* 90*  CREATININE 0.69  --  0.67 0.76   Estimated Creatinine Clearance: 84.8 ml/min (by C-G formula based on Cr of 0.76).  Microbiology: Recent Results (from the past 720 hour(s))  CULTURE, BLOOD (ROUTINE X 2)     Status: None   Collection Time    04/08/14 10:19 PM      Result Value Ref Range Status   Specimen Description BLOOD RIGHT ANTECUBITAL   Final   Special Requests BOTTLES DRAWN AEROBIC ONLY 10CC   Final   Culture NO GROWTH 2 DAYS   Final   Report Status PENDING   Incomplete  CULTURE, BLOOD (ROUTINE X 2)     Status: None   Collection Time    04/08/14 10:20 PM      Result Value Ref Range Status   Specimen Description BLOOD RIGHT ARM   Final   Special Requests BOTTLES DRAWN AEROBIC AND ANAEROBIC 8CC   Final   Culture NO GROWTH 2 DAYS   Final   Report Status PENDING   Incomplete   Medical History: Past Medical History  Diagnosis Date  . Stroke    Medications:  Scheduled:  . cefTRIAXone (ROCEPHIN)  IV  1 g Intravenous Q24H  . sodium chloride  3 mL Intravenous Q12H   Infusions:  . sodium chloride 100 mL/hr at 04/09/14 0232   PRN: acetaminophen, acetaminophen, albuterol, ondansetron (ZOFRAN) IV, ondansetron  Patient received one dose each of  azithromycin & ceftriaxone on admission 7/12 Vancomycin 7/13 >> 7/14 Zosyn 7/13 >> 7/14 Rocephin 7/14 >>  Assessment: 89yr female morbidly obese with elevated LFT's, low serum albumin, elevated Ca++ (corrected for low albumin), low PLTC, & elevated WBC.  Patient to be started on Zosyn & vancomycin for sepsis coverage then switched to Rocephin for pyelonephritis.    Goal of Therapy:  Eradicate infection.  Plan:  Rocephin 1gm IV q24hrs Monitor labs and cultures  Hart Robinsons A 04/10/2014,10:59 AM

## 2014-04-10 NOTE — Evaluation (Signed)
Physical Therapy Evaluation Patient Details Name: Dawn Beard MRN: [id number] DOB: 14-Jun-1956 Today's Date: 04/10/2014   History of Present Illness  Dawn Beard is a 58 y.o. female who recently moved to the [place] from [place] approximately 3-4 days ago. The patient does not speak any [place]. Her sons are translating during my visit. Patient is somewhat of a poor historian. Initially, he was reported the patient was having fever, shortness of breath and headache for the past 3 days. On further questioning, her son reports that she has not been feeling well for several weeks now. She usually has fevers during the evening/night. She has associated headache in her temporal regions bilaterally. No recent changes in vision. She's not had any diarrhea her on antibiotics. She's not had any dysuria. She's had no cough, hemoptysis, weight loss. No chest pain. She denies any abdominal pain or vomiting. She does not have any sore throat. She denies any neck stiffness/pain. Her family reports that she usually does well during the day and feels poorly at night. They report that she had seen several doctors in [place] without any definitive diagnosis and therefore her family brought her to the [place] for further treatment. There's been no development of recent rashes. Her family reports that she did have a stroke approximately 2 months ago which resulted in some left sided hemiparesis. Her neurologic deficits have improved since onset. The patient is able to stand and cook without difficulty. She does not smoke or consume alcohol. Workup in the emergency room his unremarkable. Urinalysis is unrevealing as is chest x-ray. She is noted to have a high fever 103.4 with a significant leukocytosis and thrombocytopenia. Liver enzymes are mildly elevated. The patient will be admitted for further workup.  Clinical Impression  Pt is a 58 year old female who presents to physical therapy with dx of  sepsis.  Language barrier as pt only speaks spanish, son present for translation during evaluation.  Noted hx of stroke 2 months ago, though no residual strength deficits noted during MMT testing.  Pt recently travelled back to the [place] from [place]; pt travels frequently between the two to visit family.  Previously, the pt was (I) with all functional mobility skills, though recently family reports she has required HHA x1 for transfers and ambulation skills.  Pt lives with her husband, and has sons around to assist 24/7 if needed.  During evaluation, pt was (I) with bed mobility skills and mod (I) with transfers with use of RW and std cane.  Amb assess with RW and std cane for safety as family reports recent history of HHA for gait.  No LOB or gait deviations noted with ambulation with cane or RW.  Pt reports she feels steady with a cane.  Recommend use of cane for ambulation skills as pt returns to PLOF, and cane would allow pt to increase her independence and not rely on family for HHA.  Pt is currently at baseline for functional mobility skills with use of std cane, no further PT recommended at this venue.  Informed family of option for possible HHPT to assess functional mobility in the home, though family states pt has made great improvements recently and does not feel like HHPT is needed.  No recommendations for services at this time as pt is able to complete all functional mobility skills without physical assist.      Follow Up Recommendations No PT follow up    Equipment Recommendations  Cane (Pt might benefit  from amb with use of cane, as family reports she has been holding onto family recently for transfers and amb.  Kasandra Knudsen would allow pt to increase independence with functional mobility.  )       Precautions / Restrictions Precautions Precautions: Fall Precaution Comments: Mining engineer (son present during evaluation to translate) Restrictions Weight Bearing Restrictions: No      Mobility   Bed Mobility Overal bed mobility: Independent                Transfers Overall transfer level: Modified independent Equipment used: Straight cane;Rolling walker (2 wheeled)                Ambulation/Gait Ambulation/Gait assistance: Supervision;Min guard Ambulation Distance (Feet): 150 Feet Assistive device: Rolling walker (2 wheeled);Straight cane Gait Pattern/deviations: Step-through pattern   Gait velocity interpretation: at or above normal speed for age/gender General Gait Details: Assessed ambulation with use of RW and std cane.  No LOB noted with either device.        Balance Overall balance assessment: No apparent balance deficits (not formally assessed)                                           Pertinent Vitals/Pain No pain reported.     Home Living Family/patient expects to be discharged to:: Private residence Living Arrangements: Spouse/significant other;Children Available Help at Discharge: Family;Available 24 hours/day Type of Home: House Home Access: Stairs to enter   [employer] of Steps: 2 Home Layout: One level Home Equipment: None      Prior Function Level of Independence: Independent         Comments: Son does report pt holds onto family arm during transfers and amb recently for safety.       Hand Dominance   Dominant Hand: Right    Extremity/Trunk Assessment               Lower Extremity Assessment: Overall WFL for tasks assessed         Communication   Communication: Interpreter utilized Personnel officer, son translated during evaluation)  Cognition Arousal/Alertness: Awake/alert Behavior During Therapy: WFL for tasks assessed/performed Overall Cognitive Status: Within Functional Limits for tasks assessed                               Assessment/Plan    PT Assessment Patent does not need any further PT services  PT Diagnosis     PT Problem List    PT Treatment  Interventions     PT Goals (Current goals can be found in the Care Plan section) Acute Rehab PT Goals PT Goal Formulation: No goals set, d/c therapy     End of Session Equipment Utilized During Treatment: Gait belt Activity Tolerance: Patient tolerated treatment well Patient left: in bed;with call bell/phone within reach;with bed alarm set;with family/visitor present           Time: 0093-8182 PT Time Calculation (min): 16 min   Charges:   PT Evaluation $Initial PT Evaluation Tier I: 1 Procedure      Dann Galicia 04/10/2014, 11:45 AM

## 2014-04-11 LAB — URINE CULTURE: Colony Count: 45000

## 2014-04-11 LAB — COMPREHENSIVE METABOLIC PANEL
ALK PHOS: 110 U/L (ref 39–117)
ALT: 26 U/L (ref 0–35)
AST: 36 U/L (ref 0–37)
Albumin: 1.7 g/dL — ABNORMAL LOW (ref 3.5–5.2)
Anion gap: 6 (ref 5–15)
BUN: 7 mg/dL (ref 6–23)
CALCIUM: 8.4 mg/dL (ref 8.4–10.5)
CO2: 20 mEq/L (ref 19–32)
Chloride: 116 mEq/L — ABNORMAL HIGH (ref 96–112)
Creatinine, Ser: 0.59 mg/dL (ref 0.50–1.10)
GFR calc non Af Amer: >90 mL/min (ref >90)
Glucose, Bld: 95 mg/dL (ref 70–99)
Potassium: 3.6 mEq/L — ABNORMAL LOW (ref 3.7–5.3)
Sodium: 142 mEq/L (ref 137–147)
Total Bilirubin: 1.1 mg/dL (ref 0.3–1.2)
Total Protein: 6.1 g/dL (ref 6.0–8.3)

## 2014-04-11 MED ORDER — POTASSIUM CHLORIDE CRYS ER 20 MEQ PO TBCR
40.0000 meq | EXTENDED_RELEASE_TABLET | ORAL | Status: DC
  Administered 2014-04-11: 40 meq via ORAL
  Filled 2014-04-11: qty 2

## 2014-04-11 MED ORDER — CIPROFLOXACIN HCL 500 MG PO TABS: 500.0000 mg | ORAL_TABLET | Freq: Two times a day (BID) | ORAL | Status: DC

## 2014-04-11 MED ORDER — CARBAMAZEPINE ER 100 MG PO TB12
100.0000 mg | ORAL_TABLET | Freq: Every day | ORAL | Status: DC
  Administered 2014-04-11: 100 mg via ORAL
  Filled 2014-04-11 (×3): qty 1

## 2014-04-11 NOTE — Discharge Summary (Signed)
Physician Discharge Summary  Dawn Beard OTL:[id number] DOB: 05/05/1956 DOA: 04/08/2014  PCP: No PCP Per Patient  Admit date: 04/08/2014 Discharge date: 04/11/2014  Time spent:  minutes  Recommendations for Outpatient Follow-up:  1. Has appointment with Laban Emperor NP for gastroenterology on 7/29 to follow up cirrhosis  2. Dr Denita Lung office will contact for follow up appointment. Recommend BMET to trend electrolytes and renal function. Mammogram ordered for OP before discharge. Follow results. Hx stroke 5/15.   Discharge Diagnoses:  Principal Problem:   Sepsis Active Problems:   Fever   Thrombocytopenia   Headache   Morbid obesity   Acute pyelonephritis   Cirrhosis   Breast mass, left   Discharge Condition: stable  Diet recommendation: heart healhty  Filed Weights   04/08/14 2139  Weight: 100.001 kg (220 lb 7.4 oz)    History of present illness:  Dawn Beard is a 58 y.o. female who recently moved to the [place] from [place] approximately 3-4 days prior to presentation on 04/09/14. The patient does not speak any [place]. Her sons translate. Patient somewhat of a poor historian. Initially, she was reported the patient was having fever, shortness of breath and headache for the previous 3 days. On further questioning, her son reported that she had not been feeling well for several weeks now. She usually has fevers during the evening/night. She had associated headache in her temporal regions bilaterally. No recent changes in vision. She'd not had any diarrhea. Not on any antibiotics. She'd not had any dysuria. She'd had no cough, hemoptysis, weight loss. No chest pain. She denied any abdominal pain or vomiting. She did not have any sore throat. She denied any neck stiffness/pain. Her family reported that she usually did well during the day and felt poorly at night. They reported that she had seen several doctors in [place] without any definitive diagnosis and  therefore her family brought her to the [place] for further treatment. There'd been no development of recent rashes. Her family reported that she did have a stroke approximately 2 months ago which resulted in some left sided hemiparesis. Her neurologic deficits have improved since onset. The patient was able to stand and cook without difficulty. She does not smoke or consume alcohol. Workup in the emergency room unremarkable. Urinalysis was unrevealing as was chest x-ray. She was noted to have a high fever 103.4 with a significant leukocytosis and thrombocytopenia. Liver enzymes mildly elevated.    Hospital Course:  Sepsis secondary to pyelonephritis per CT. Resolved. Max temp 100.5 last evening. Hemodynamically stable at discharge. Blood cultures with no growth to date. Urine culture 45,000 colonies/ml E. Coli. She received vanc and zosyn on admission that was changed to rocephin day #2. At discharge will provide cipro for 12 days to complete 14 day course. At discharge she is afebrile and non-toxic appearing.   Pyelonephritis: urine output good. No flank pain at discharge. See #1. cipro 516m BID for 12 days to complete 14 day course. Will follow up with Dr. DCindie Laroche    Cirrhosis: denies hx of ETOH. INR 1.5. Elevated enzymes on admission. AST, ALT and Alk phos within the limits of normal at discharge. Total bilirubin within limits of normal at discharge. Hep panel non-reactive. She has appointment with GI as outpatient on 04/25/14.   Thrombocytopenia. Likely related to #3. B12 1942 and folate 7.1. Home meds include supplements. No s/sx bleeding.   Headache. Resolved.   Elevated liver enzymes. See #2   Hx stroke: mild  left sided weakness resolved. Not on aspirin likely due to #4. Home meds include tegretol. Will continue. No hx seizure. Evaluated by physical therapy and no OP PT recommended  Left breast mass: per CT. OP mammogram  ordered   Procedures:  none  Consultations:  none  Discharge Exam: Filed Vitals:   04/11/14 0433  BP: 114/76  Pulse: 71  Temp: 97.7 F (36.5 C)  Resp: 22    General: obese appears comfortable smiling Cardiovascular: RRR No MGR No LE edema Respiratory: normal effort BS clear bilaterally no wheeze Abdomen: obese soft +BS non-tender to palpation  Discharge Instructions You were cared for by a hospitalist during your hospital stay. If you have any questions about your discharge medications or the care you received while you were in the hospital after you are discharged, you can call the unit and asked to speak with the hospitalist on call if the hospitalist that took care of you is not available. Once you are discharged, your primary care physician will handle any further medical issues. Please note that NO REFILLS for any discharge medications will be authorized once you are discharged, as it is imperative that you return to your primary care physician (or establish a relationship with a primary care physician if you do not have one) for your aftercare needs so that they can reassess your need for medications and monitor your lab values.  Discharge Instructions   Diet - low sodium heart healthy    Complete by:  As directed      Increase activity slowly    Complete by:  As directed             Medication List         ciprofloxacin 500 MG tablet  Commonly known as:  CIPRO  Take 1 tablet (500 mg total) by mouth 2 (two) times daily.       Allergies  Allergen Reactions  . Shrimp [Shellfish Allergy]        Follow-up Information   Follow up with Laban Emperor, NP On 04/25/2014. (has appointment for 11:30 am. follow up cirrhosis)    Specialty:  Gastroenterology   Contact information:   [address] [place] [address] [phone]       Follow up with Maricela Curet, MD. (office will call you to schedule )    Specialty:  Internal Medicine   Contact  information:   [place] [address] [phone]        The results of significant diagnostics from this hospitalization (including imaging, microbiology, ancillary and laboratory) are listed below for reference.    Significant Diagnostic Studies: Ct Chest W Contrast  04/09/2014   CLINICAL DATA:  Fever.  Sepsis of unclear etiology.  EXAM: CT CHEST, ABDOMEN, AND PELVIS WITH CONTRAST  TECHNIQUE: Multidetector CT imaging of the chest, abdomen and pelvis was performed following the standard protocol during bolus administration of intravenous contrast.  CONTRAST:  173m OMNIPAQUE IOHEXOL 300 MG/ML  SOLN  COMPARISON:  None.  FINDINGS: CT CHEST FINDINGS  Cardiac silhouette is borderline enlarged. Great vessels are normal in caliber. No mediastinal or hilar masses or enlarged lymph nodes. No neck base or axillary masses or adenopathy.  There is dependent subsegmental atelectasis most evident in the posterior lower lobes. No lung consolidation or edema. No lung nodule or mass. No effusion or pneumothorax.  There is a 15 mm relatively hyper attenuating mass in the left breast.  CT ABDOMEN AND PELVIS FINDINGS  There  is an area of wedge-shaped hypoattenuation from the lower pole of the left kidney, which measures 3.5 cm x 3.2 cm in greatest transverse dimension. There is some perinephric stranding and thickening of the perirenal fascia. No discrete fluid collection is seen to suggest an abscess. Findings support pyelonephritis. No convincing right pyelonephritis. No renal mass. No hydronephrosis. Normal ureters. Unremarkable bladder.  Liver shows morphologic changes consistent with cirrhosis with subtle surface nodularity and central volume loss and relative enlargement of the caudate lobe. No liver mass or focal lesion is seen.  Spleen is enlarged measuring 20 cm by 13 cm x 6.9 cm. No splenic mass or focal lesions.  There are prominent venous collaterals that extend from the splenic hilum to the  left renal vein.  There is a trace amount of ascites, noted adjacent to the liver.  Gallbladder and pancreas are unremarkable. No bile duct dilation. No adrenal masses. There are mildly enlarged gastrohepatic ligament lymph nodes, the largest measuring 13.5 mm in short axis. These are nonspecific but presumed reactive, and a common finding in the setting of cirrhosis.  Status post hysterectomy. No pelvic masses. Colon and small bowel are unremarkable. Appendix not visualized. No abnormal fluid collections are seen to suggest an abscess. Small fat containing umbilical hernia also containing a small amount of fluid attenuation. No bowel.  There are degenerative changes throughout the visualized spine as well as an S-shaped scoliosis. No osteoblastic or osteolytic lesions.  IMPRESSION: 1. Findings consistent with pyelonephritis of the lower pole of the left kidney, likely the source of sepsis. No evidence of an abscess. No other findings to suggest a source of infection. 2. Cirrhosis with portal venous hypertension reflected by splenomegaly and splenorenal vascular collaterals. No liver mass or focal lesion. 3. Trace ascites. 4. Small left breast mass. Recommend correlation with mammography if mammography has not been recently performed. 5. Multiple other chronic findings as detailed.   Electronically Signed   By: Lajean Manes M.D.   On: 04/09/2014 11:24   Ct Abdomen Pelvis W Contrast  04/09/2014   CLINICAL DATA:  Fever.  Sepsis of unclear etiology.  EXAM: CT CHEST, ABDOMEN, AND PELVIS WITH CONTRAST  TECHNIQUE: Multidetector CT imaging of the chest, abdomen and pelvis was performed following the standard protocol during bolus administration of intravenous contrast.  CONTRAST:  135m OMNIPAQUE IOHEXOL 300 MG/ML  SOLN  COMPARISON:  None.  FINDINGS: CT CHEST FINDINGS  Cardiac silhouette is borderline enlarged. Great vessels are normal in caliber. No mediastinal or hilar masses or enlarged lymph nodes. No neck base or  axillary masses or adenopathy.  There is dependent subsegmental atelectasis most evident in the posterior lower lobes. No lung consolidation or edema. No lung nodule or mass. No effusion or pneumothorax.  There is a 15 mm relatively hyper attenuating mass in the left breast.  CT ABDOMEN AND PELVIS FINDINGS  There is an area of wedge-shaped hypoattenuation from the lower pole of the left kidney, which measures 3.5 cm x 3.2 cm in greatest transverse dimension. There is some perinephric stranding and thickening of the perirenal fascia. No discrete fluid collection is seen to suggest an abscess. Findings support pyelonephritis. No convincing right pyelonephritis. No renal mass. No hydronephrosis. Normal ureters. Unremarkable bladder.  Liver shows morphologic changes consistent with cirrhosis with subtle surface nodularity and central volume loss and relative enlargement of the caudate lobe. No liver mass or focal lesion is seen.  Spleen is enlarged measuring 20 cm by 13 cm x 6.9 cm. No  splenic mass or focal lesions.  There are prominent venous collaterals that extend from the splenic hilum to the left renal vein.  There is a trace amount of ascites, noted adjacent to the liver.  Gallbladder and pancreas are unremarkable. No bile duct dilation. No adrenal masses. There are mildly enlarged gastrohepatic ligament lymph nodes, the largest measuring 13.5 mm in short axis. These are nonspecific but presumed reactive, and a common finding in the setting of cirrhosis.  Status post hysterectomy. No pelvic masses. Colon and small bowel are unremarkable. Appendix not visualized. No abnormal fluid collections are seen to suggest an abscess. Small fat containing umbilical hernia also containing a small amount of fluid attenuation. No bowel.  There are degenerative changes throughout the visualized spine as well as an S-shaped scoliosis. No osteoblastic or osteolytic lesions.  IMPRESSION: 1. Findings consistent with pyelonephritis of  the lower pole of the left kidney, likely the source of sepsis. No evidence of an abscess. No other findings to suggest a source of infection. 2. Cirrhosis with portal venous hypertension reflected by splenomegaly and splenorenal vascular collaterals. No liver mass or focal lesion. 3. Trace ascites. 4. Small left breast mass. Recommend correlation with mammography if mammography has not been recently performed. 5. Multiple other chronic findings as detailed.   Electronically Signed   By: Lajean Manes M.D.   On: 04/09/2014 11:24   Dg Chest Portable 1 View  04/08/2014   CLINICAL DATA:  Shortness of Breath  EXAM: PORTABLE CHEST - 1 VIEW  COMPARISON:  None.  FINDINGS: Lungs are clear. Heart is upper normal in size with normal pulmonary vascularity. No adenopathy. No bone lesions.  IMPRESSION: No edema or consolidation.   Electronically Signed   By: Lowella Grip M.D.   On: 04/08/2014 22:43    Microbiology: Recent Results (from the past 240 hour(s))  CULTURE, BLOOD (ROUTINE X 2)     Status: None   Collection Time    04/08/14 10:19 PM      Result Value Ref Range Status   Specimen Description BLOOD RIGHT ANTECUBITAL   Final   Special Requests BOTTLES DRAWN AEROBIC ONLY 10CC   Final   Culture NO GROWTH 2 DAYS   Final   Report Status PENDING   Incomplete  CULTURE, BLOOD (ROUTINE X 2)     Status: None   Collection Time    04/08/14 10:20 PM      Result Value Ref Range Status   Specimen Description BLOOD RIGHT ARM   Final   Special Requests BOTTLES DRAWN AEROBIC AND ANAEROBIC 8CC   Final   Culture NO GROWTH 2 DAYS   Final   Report Status PENDING   Incomplete  URINE CULTURE     Status: None   Collection Time    04/08/14 11:05 PM      Result Value Ref Range Status   Specimen Description URINE, CLEAN CATCH   Final   Special Requests NONE   Final   Culture  Setup Time     Final   Value: 04/09/2014 17:20     Performed at [place]     Final   Value: 45,000 COLONIES/ML      Performed at [facility]   Culture     Final   Value: ESCHERICHIA COLI     Performed at [facility]   Report Status PENDING   Incomplete     Labs: Basic Metabolic Panel:  Recent [employer]  04/08/14 2220 04/09/14 0514 04/10/14 0802 04/11/14 0605  NA 138 141 141 142  K 4.3 4.0 3.9 3.6*  CL 107 114* 112 116*  CO2 _0 GLUCOSE 95 133* 99 95  BUN _1 CREATININE 0.69 0.67 0.76 0.59  CALCIUM 9.7 8.6 8.5 8.4   Liver Function Tests:  Recent Labs Lab 04/08/14 2220 04/09/14 0514 04/10/14 0802 04/11/14 0605  AST 45* 32 29 36  ALT 40* _2 ALKPHOS 162* 125* 115 110  BILITOT 1.3* 1.2 1.7* 1.1  PROT 7.4 5.6* 5.9* 6.1  ALBUMIN 2.2* 1.6* 1.8* 1.7*   No results found for this basename: LIPASE, AMYLASE,  in the last 168 hours No results found for this basename: AMMONIA,  in the last 168 hours CBC:  Recent Labs Lab 04/08/14 2314 04/09/14 0514 04/10/14 0802  WBC 19.4* 19.0* 10.2  NEUTROABS 16.4*  --   --   HGB 13.4 11.9* 11.9*  HCT 39.2 35.3* 34.6*  MCV 102.1* 103.8* 103.0*  PLT 92* 87* 90*   Cardiac Enzymes:  Recent Labs Lab 04/08/14 2220 04/09/14 0131 04/09/14 0649 04/09/14 1307  TROPONINI <0.30 <0.30 <0.30 <0.30   BNP: BNP (last 3 results)  Recent Labs  04/08/14 2220  PROBNP 618.2*   CBG: No results found for this basename: GLUCAP,  in the last 168 hours     Signed:  Radene Gunning  [facility] 04/11/2014, 9:37 AM

## 2014-04-11 NOTE — Discharge Summary (Signed)
I have seen and examined this patient and I agree with the above assessment and plan.  Costin Gherghe, MD [facility] [phone]  

## 2014-04-11 NOTE — Discharge Instructions (Signed)
Take medication as directed.   Dawn Beard, Adultos  (Fever, Adult) La fiebre es la temperatura superior a la normal del cuerpo. En el adulto, una temperatura normal es de alrededor de 98,6 F (37 C). Cuando la temperatura es de 100,4 F (38 C) o ms, se considera fiebre. Una fiebre entre leve y moderada generalmente no presenta efectos a largo plazo y no requiere Clinical research associate. Una fiebre extrema (mayor o igual a [place]) puede causar convulsiones. La sudoracin que ocurre en la fiebre repetida o prolongada puede causar deshidratacin. Los Manpower Inc pueden presentar confusin [employer] episodios de [place].  La medicin de la temperatura puede variar con:   La edad.  El momento del da.  El modo en que se mide (boca, axila, recto u odo). La fiebre se confirma tomando la temperatura con un termmetro. La temperatura puede tomarse de diferentes modos. Algunos mtodos son precisos y otros no lo son.   Generalmente se toma la temperatura oral. Los termmetros electrnicos son rpidos y Passenger transport manager.  La temperatura tomada en el odo solo ser precisa si el termmetro se ubica segn las indicaciones del fabricante.  La temperatura rectal es precisa y se toma en aquellos adultos que tienen una enfermedad en la que no puede tomarse la temperatura oral.  La temperatura que se toma debajo del brazo Art therapist) no es precisa y no se recomienda. La fiebre es un sntoma, no es una enfermedad.  Redan infecciones causan fiebre.  Las causas de fiebre sin infeccin pueden ser:  Campbell Soup de artritis.  Ciertas enfermedades de la glndula tiroides o suprarrenal.  Algunas enfermedades del sistema inmunolgico.  Ciertos tipos de cncer.  La reaccin a un medicamento.  Consumir dosis elevadas de ciertas drogas como la metamfetamina.  Deshidratacin.  Exposicin a temperaturas elevadas en el exterior o en un ambiente.  En ocasiones, la causa de la fiebre no puede  determinarse. En estos casos se denomina "fiebre de origen desconocido" (FOD).  Algunas situaciones pueden llevar a un aumento temporario de Environmental education officer, que puede desaparecer sin tratamiento. Algunos ejemplos son:  El parto  [place].  Actividad fsica intensa. [place] los medicamentos adecuados para la fiebre. Siga atentamente las instrucciones relacionadas con la dosis. Si utiliza acetaminofeno para bajar la fiebre, tenga la precaucin de Product/process development scientist tomar otros medicamentos que tambin contengan acetaminofeno. Los menores de 19 aos no deben tomar aspirina para Primary school teacher. Hay una asociacin con el sndrome de [place]. El sndrome de [place] es una enfermedad rara pero potencialmente fatal.  Si sufre una infeccin y le han recetado antibiticos, tmelos como se le ha indicado. Tmelos todos, aunque se sienta mejor.  Descanse todo lo que pueda.  Mantenga una adecuada ingesta de lquidos. Para evitar la deshidratacin durante una enfermedad con fiebre prolongada o recurrente, puede necesitar tomar lquidos extra.Debe ingerir la cantidad de lquido suficiente como para Theatre manager la orina de tono claro o color amarillo plido.  Pasar una esponja o un bao con agua a temperatura ambiente puede ayudar a reducir Environmental education officer. No use agua con hielo ni pase esponjas con alcohol fino.  Vstase de manera cmoda pero no se abrigue mucho. SOLICITE ATENCIN MDICA SI:   No puede retener lquidos.  Tiene vmitos o diarrea.  No mejora, aunque sea parcialmente, luego de 3 das.  Desarrolla nuevos e inexplicables sntomas o problemas. SOLICITE ATENCIN MDICA DE INMEDIATO SI:   Dawn Beard  falta el aire o tiene dificultad para respirar.  Se siente cada vez ms dbil.  Se siente mareado o se desmaya.  Tiene mucha sed, orina poco o no orina en absoluto.  Siente nuevos dolores que no haba sentido antes (como dolor de [place], [place], pecho,  espalda o abdomen).  Tiene vmitos persistentes y diarrea durante ms de 1  2 das.  Siente rigidez en el cuello o sus ojos tienen sensibilidad a Naval architect.  Aparece una erupcin cutnea.  Tiene fiebre o sntomas que persisten durante ms de 2  3 das.  Tiene fiebre y los sntomas empeoran de manera repentina. ASEGRESE DE QUE:   Comprende estas instrucciones.  Controlar su enfermedad.  Solicitar ayuda de inmediato si no mejora o si empeora. Document Released: 06/24/2005 Document Revised: 12/07/2011 [facility] Patient Information 2015 [place]. This information is not intended to replace advice given to you by your health care provider. Make sure you discuss any questions you have with your health care provider.  Pielonefritis - Adultos  (Pyelonephritis, Adult)  La pielonefritis es una infeccin del rin. Hay dos tipos principales de pielonefritis:   Una infeccin que se inicia rpidamente sin sntomas previos (pielonefritis [place]).  Infecciones que persisten por un largo perodo (pielonefritis crnica). CAUSAS  Hay dos causas principales:   Pasaje de bacterias desde la vejiga al rin. Este problema aparece especialmente en mujeres embarazadas. La orina en la vejiga puede infectarse por diferentes causas, por ejemplo:  Inflamacin de la prstata (prostatitis).  Durante las [employer].  Infeccin en la vejiga (cistitis).  Pasaje de bacterias desde la sangre hacia el rin. Las enfermedades que aumentan el riesgo son:   Diabetes.  Clculos renales o en la vescula.  Cncer.  Un catter colocado en la vejiga.  Otras anormalidades del rin o de Geologist, engineering. SNTOMAS   Dolor abdominal  Dolor en la zona del costado o flanco.  Dawn Beard.  Escalofros.  Malestar estomacal.  Sangre en la orina Dawn Beard).  Necesidad frecuente de orinar  Necesidad intensa o persistente de Garment/textile technologist.  Sensacin de ardor o pinchazos al  [employer]. DIAGNSTICO  El mdico diagnosticar una infeccin en su rin basndose en los sntomas. Tambin tomar [place] de [place].  TRATAMIENTO  Generalmente el tratamiento depende de la gravedad de la infeccin.   Si la infeccin es leve y se diagnostica a tiempo, el mdico lo tratar con antibiticos por va oral y lo dejar irse a su casa.  Si la infeccin es ms grave, la bacteria podra haber ingresado al torrente sanguneo. Esto requerir antibiticos por va intravenosa y Futures trader en el hospital. Los sntomas pueden incluir:  Fiebre alta.  Dolor intenso en un costado del cuerpo.  Escalofros  An despus de Copy hospital, el mdico podr indicarle antibiticos por va oral durante cierto perodo de [place].  Podr prescribirle otros tratamientos segn la causa de la infeccin. [place] los antibiticos como se le indic. Tmelos todos, aunque se sienta mejor.  Concurra para Optometrist un control y asegurarse de que la infeccin ha desaparecido.  Debe ingerir gran cantidad de lquido para mantener la orina de tono claro o color amarillo plido.  Tome medicamentos para la vejiga si siente urgencia para Garment/textile technologist o lo hace con mucha frecuencia. SOLICITE ATENCIN MDICA DE INMEDIATO SI:   Tiene fiebre o sntomas persistentes durante ms de 2  3 das.  Tiene fiebre y los sntomas  empeoran.  No puede tomar los antibiticos ni ingerir lquidos.  Comienza a sentir escalofros.  Siente debilidad extrema o se desmaya.  No mejora despus de 2 das de tratamiento. ASEGRESE DE QUE:   Comprende estas instrucciones.  Controlar su enfermedad.  Solicitar ayuda de inmediato si no mejora o empeora. Document Released: 06/24/2005 Document Revised: 03/15/2012 [facility] Patient Information 2015 [place]. This information is not intended to replace advice given to you by your health care provider. Make sure you  discuss any questions you have with your health care provider.  Obesidad  (Obesity)  Se considera obesidad cuando hay demasiada grasa corporal y el ndice de masa corporal [facility]) es de 30 o ms, [facility] es un nmero basado en la altura y [place]. El nmero es una estimacin de la cantidad de grasa en el cuerpo. La obesidad se produce cuando se consumen ms caloras de las que se queman haciendo ejercicios u [place]. Puede ser la causa de graves problemas de salud o de situaciones de Freight forwarder.  CUIDADOS EN EL HOGAR   Haga los ejercicios y [place] segn le indic su mdico. Intente:  Usar las escaleras siempre que pueda.  Estacionar lejos de la entrada de las tiendas.  Arreglar el jardn, andar en bicicleta o caminar.  Consuma alimentos y bebidas saludables que sean bajos en caloras. Coma ms frutas y verduras frescas.  Limite las comidas rpidas, los dulces, los snacks elaborados con ingredientes que no sean naturales (alimentos procesados).  Coma porciones ms pequeas.  Lleve un diario y anote lo que come [employer]. Hay pginas web que pueden ayudarlo.  Evite consumir alcohol. Beba ms agua y bebidas sin caloras.  Tome los comprimidos de vitaminas y dietarios (suplementos) exactamente como le indic el mdico.  Concurra a grupos de apoyo para perder peso o a clases para ayudarlo a Investment banker, operational. Un nutricionista o un terapeuta tambin pueden ayudarlo. SOLICITE AYUDA DE INMEDIATO SI:   Siente dolor u opresin en el pecho.  Siente dificultad para respirar o Risk manager.  Se siente dbil o pierde la sensibilidad (adormecimiento) en las piernas.  Se siente confundido o tiene dificultad para respirar.  Tiene cambios bruscos en la visin. ASEGRESE DE QUE:   Comprende estas instrucciones.  Controlar su enfermedad.  Solicitar ayuda de inmediato si no mejora o si empeora. Document Released: 03/15/2012 [facility] Patient Information 2015  [place]. This information is not intended to replace advice given to you by your health care provider. Make sure you discuss any questions you have with your health care provider.

## 2014-04-13 LAB — CULTURE, BLOOD (ROUTINE X 2)
Culture: NO GROWTH
Culture: NO GROWTH

## 2014-04-25 ENCOUNTER — Encounter: Payer: Self-pay | Admitting: Gastroenterology

## 2014-04-25 ENCOUNTER — Ambulatory Visit (INDEPENDENT_AMBULATORY_CARE_PROVIDER_SITE_OTHER): Payer: Self-pay | Admitting: Gastroenterology

## 2014-04-25 VITALS — BP 130/80 | HR 64 | Temp 96.8°F | Ht 60.0 in | Wt 226.8 lb

## 2014-04-25 DIAGNOSIS — K7460 Unspecified cirrhosis of liver: Secondary | ICD-10-CM

## 2014-04-25 NOTE — Progress Notes (Signed)
Primary Care Physician:  No PCP Per Patient Primary Gastroenterologist:  Dr. Gala Romney   Chief Complaint  Patient presents with  . Follow-up    FOLLOW UP FROM HOSPITAL    HPI:   Dawn Beard presents today at the request of the hospitalist service secondary to new diagnosis of cirrhosis. Recently moved to the [place] from [place] in early July. Admitted with sepsis secondary to pyelonephritis. Cirrhosis noted on CT. Viral markers negative. Son present as Optometrist. No prior history of liver disease. Denies abdominal pain, N/V, reflux, dysphagia, rectal bleeding, change in bowel habits. Vague early satiety noted but no evidence of weight loss or other concerning signs.   Uses salt on food.  Past Medical History  Diagnosis Date  . Stroke   . Arthritis     Past Surgical History  Procedure Laterality Date  . Appendectomy    . External ear surgery    . Abdominal hysterectomy      Current Outpatient Prescriptions  Medication Sig Dispense Refill  . NON FORMULARY Indometacina  From [place]    25 mg   BID for leg pain       No current facility-administered medications for this visit.    Allergies as of 04/25/2014 - Review Complete 04/25/2014  Allergen Reaction Noted  . Shrimp [shellfish allergy]  04/08/2014    Family History  Problem Relation Age of Onset  . Liver cancer Mother   . Colon cancer Neg Hx     History   Social History  . Marital Status: Single    Spouse Name: N/A    Number of Children: N/A  . Years of Education: N/A   Occupational History  . Not on file.   Social History Main Topics  . Smoking status: Never Smoker   . Smokeless tobacco: Never Used     Comment: Never smoked  . Alcohol Use: No  . Drug Use: No  . Sexual Activity: Not on file   Other Topics Concern  . Not on file   Social History Narrative  . No narrative on file    Review of Systems: As mentioned in HPI.   Physical Exam: BP 130/80  Pulse 64  Temp(Src) 96.8 F (36  C) (Oral)  Ht 5' (1.524 m)  Wt 226 lb 12.8 oz (102.876 kg)  BMI 44.29 kg/m2 General:   Alert and oriented. Pleasant and cooperative. Well-nourished and well-developed.  Head:  Normocephalic and atraumatic. Eyes:  Without icterus, sclera clear and conjunctiva pink.  Ears:  Normal auditory acuity. Nose:  No deformity, discharge,  or lesions. Mouth:  No deformity or lesions, oral mucosa pink.  Lungs:  Clear to auscultation bilaterally. No wheezes, rales, or rhonchi. No distress.  Heart:  S1, S2 present without murmurs appreciated.  Abdomen:  +BS, soft, non-tender and non-distended. No HSM appreciated. Difficult due to large AP diameter.  No guarding or rebound. No masses appreciated.  Rectal:  Deferred  Msk:  Symmetrical without gross deformities. Normal posture. Extremities:  1+ lower extremity edema.  Neurologic:  Alert and  oriented x4;  grossly normal neurologically. Skin:  Intact without significant lesions or rashes. Psych:  Alert and cooperative. Normal mood and affect.   Lab Results  Component Value Date   ALT 26 04/11/2014   AST 36 04/11/2014   ALKPHOS 110 04/11/2014   BILITOT 1.1 04/11/2014   Lab Results  Component Value Date   CREATININE 0.59 04/11/2014   BUN 7 04/11/2014   NA  142 04/11/2014   K 3.6* 04/11/2014   CL 116* 04/11/2014   CO2 20 04/11/2014   Lab Results  Component Value Date   WBC 10.2 04/10/2014   HGB 11.9* 04/10/2014   HCT 34.6* 04/10/2014   MCV 103.0* 04/10/2014   PLT 90* 04/10/2014

## 2014-04-25 NOTE — Patient Instructions (Signed)
You will need an upper endoscopy and colonoscopy with Dr. Gala Romney once insurance is in effect.  Please have blood work completed once insurance has been obtained.  It is very important that you follow a low salt diet. Please see attached!  Plan de alimentacin con bajo contenido de sodio (Low-Sodium Eating Plan) El sodio aumenta la presin arterial y hace que el cuerpo retenga lquidos. El consumo de alimentos con menos sodio ayuda a Armed forces technical officer presin arterial, a Forensic psychologist y a Tour manager, el hgado y los riones. El hecho de agregar sal (cloruro de sodio) a los alimentos aumenta el aporte de [place]. La mayor parte del sodio proviene de los alimentos enlatados, envasados y congelados. La pizza, la comida rpida y la comida de los restaurantes tambin contienen mucho sodio. Aunque usted tome medicamentos para bajar la presin arterial o reducir el lquido del cuerpo, es importante que disminuya el aporte de sodio de los alimentos. EN QU CONSISTE EL PLAN? La [employer] de las personas deberan limitar la ingesta de sodio a 2300mg  por Training and development officer. El mdico le recomienda que limite su consumo de sodio a __________ [employer].  QU DEBO SABER ACERCA DE ESTE PLAN DE [place]? Para el plan de alimentacin con bajo contenido de sodio, debe seguir estas pautas generales:  Elija alimentos con un valor porcentual diario de sodio de menos del 5% (segn se indica en la etiqueta).  Use hierbas o aderezos sin sal, en lugar de sal de mesa o sal marina.  Consulte al mdico o farmacutico antes de usar sustitutos de la sal.  Coma alimentos frescos.  Coma ms frutas y verduras.  Limite las verduras enlatadas. Si las consume, enjuguelas bien para disminuir el sodio.  Limite el consumo de queso a 1oz (28g) por Training and development officer.  Coma productos con bajo contenido de sodio, cuya etiqueta suele decir "bajo contenido de sodio" o "sin agregado de sal".  Evite alimentos que contengan glutamato monosdico  (MSG), que a veces se agrega a la comida [place] y a algunos alimentos enlatados.  Consulte las etiquetas de los alimentos (etiquetas de informacin nutricional) para saber cunto sodio contiene una porcin.  Consuma ms alimentos caseros y [employer] comida rpida, de bufs o de restaurantes.  Cuando coma en un restaurante, pida que le preparen la comida con menos sal o, si es posible, sin nada de sal. CMO LEO LA INFORMACIN SOBRE EL SODIO EN LAS ETIQUETAS DE LOS ALIMENTOS? La etiqueta de informacin nutricional indica la cantidad de sodio en una porcin de alimento. Si come ms de una porcin, debe multiplicar la cantidad indicada de sodio por la cantidad de porciones. Las etiquetas de los alimentos tambin pueden indicar lo siguiente:  Sin sodio: menos de 5mg  por porcin.  Cantidad muy baja de sodio: 35mg  o menos por porcin.  Cantidad baja de sodio: 140mg  o menos por porcin.  Menor cantidad de sodio: 50% menos de sodio en una porcin. Por ejemplo, si un alimento que por lo general contiene 300mg  de sodio se modifica para incluir una menor cantidad de sodio, tendr 150mg  de sodio.  Sodio reducido: 25% menos de Agricultural consultant. Por ejemplo, si un alimento que por lo general contiene 400mg  de sodio se modifica para convertirse en un alimento de sodio reducido, tendr 300mg  de sodio. QU ALIMENTOS PUEDO COMER? Cereales Cereales con bajo contenido de sodio, como [employer], arroz y trigo [place], y trigo triturado. Galletas con bajo contenido de [place]. Arroz y pastas sin sal. Pan con bajo  contenido de [place].  Vegetales Verduras frescas o congeladas. Verduras enlatadas con bajo contenido de sodio o reducido de sodio. Pasta y salsa de tomate con contenido bajo o [place]. Jugos de tomate y verduras con contenido bajo o reducido de sodio.  Lambert Mody Frutas frescas, congeladas y IT sales professional. Jugo de frutas.  Carnes y otras fuentes de protenas Atn y salmn enlatado con bajo  contenido de sodio. Carne de vaca o ave, pescado y frutos de mar frescos o congelados. Cordero. Frutos secos sin sal. Lentejas, frijoles y guisantes secos, sin sal agregada. Frijoles enlatados sin sal. Sopas caseras sin sal. Huevos.  Lcteos Leche. Leche de soja. Queso ricota. Quesos con contenido bajo o reducido de sodio. Yogur.  Condimentos Hierbas y especias frescas y secas. Aderezos sin sal. Cebolla y ajo en polvo. Variedades de [place] y ketchup con bajo contenido de sodio. Jugo de limn.  Grasas y aceites Aderezos para ensalada con contenido reducido de [place]. Mantequilla sin sal.  Otros Palomitas de maz y pretzels sin sal.  Esta no es una lista completa de los alimentos o las bebidas recomendados. Consulte a su nutricionista para conocer ms opciones. QU ALIMENTOS NO ESTN RECOMENDADOS? Cereales Cereales instantneos para comer caliente. Mezclas para bizcochos, panqueques y rellenos de pan. Crutones. Mezclas para pastas o arroz con condimento. Envases comerciales de sopa de fideos. Macarrones con queso envasados o congelados. Harina leudante. Galletas saladas comunes. Vegetales Verduras enlatadas comunes. Pasta y salsa de tomate en lata comunes. Jugos comunes de tomate y de verduras. Verduras Surveyor, minerals. Papas fritas saladas. Aceitunas. Angie Fava. Salsas. Chucrut. Salsa. Carnes y otras fuentes de protenas Carne de [place], pescado o frutos de mar que est [place], [place], [place], condimentada con especias o con pickles. Panceta, jamn, salchichas, hot dogs, carne curada, carne picada (carne envasada de buey) y embutidos. Cerdo salado. Cecina o charqui. Arenque en escabeche. Anchoas, atn enlatado comn y sardinas. Frutos secos con sal. Ines Bloomer para untar y quesos procesados. Requesn. Queso azul y cottage. Suero de [place].  Condimentos Sal de cebolla y ajo, sal condimentada, sal de mesa y sal marina. Salsas en lata y envasadas. Salsa [place]. Salsa trtara. Salsa  barbacoa. Salsa teriyaki. Salsa de soja, incluso la que tiene contenido reducido de [place]. Salsa de carne. Salsa de pescado. Salsa de [place]. Salsa rosada. Rbanos picantes. Ketchup y mostaza comunes. Saborizantes y tiernizantes para carne. Caldo en cubitos. Salsa picante. Salsa tabasco. Adobos. Aderezos para tacos. Salsas. Grasas y aceites Aderezos comunes para ensalada. DeBary con sal. Margarina. Mantequilla clarificada. Grasa de panceta.  Otros Nachos y papas fritas envasadas. Maz inflado y frituras de maz. Palomitas de maz y pretzels con sal. Sopas enlatadas o en polvo. Pizza. Pasteles y entradas congeladas.  Esta no es [employer] Company de los alimentos y las bebidas que Nurse, adult. Consulte a su nutricionista para obtener ms informacin. Document Released: 09/14/2005 Document Revised: 09/19/2013 [facility] Patient Information 2015 [place]. This information is not intended to replace advice given to you by your health care provider. Make sure you discuss any questions you have with your health care provider. Cirrosis (Cirrhosis) Le han diagnosticado que padece cirrosis. Esta enfermedad consiste en un proceso de cicatrizacin del hgado, que se origina cuando este rgano trata de repararse despus sufrir un dao. El dao puede provenir de una infeccin previa, como la que se produce luego de una de las formas de la hepatitis (generalmente hepatitis C) o tambin por la accin de algunas toxinas. La principal toxina es el  alcohol. La cicatrizacin del hgado que causa el alcohol es irreversible. Significa que el hgado no puede volver a su estado normal aunque no se consuma ms alcohol. El dao principal que causa la infeccin por hepatitis C es la enfermedad crnica (de larga duracin) del hgado, y tambin puede conducir a la cirrosis. Esta complicacin es progresiva e irreversible. CAUSAS Antes de disponer de las pruebas de [place], la hepatitis B poda contraerse por transfusiones  de [place]. Ahora es bastante improbable, ya que las pruebas han mejorado. Esta infeccin tambin puede adquirirse por el uso de drogas por va intravenosa y por compartir las agujas. Tambin puede contagiarse a travs de las [employer]. La lesin que produce el alcohol se debe al Tyson Foods. El hecho de beber algunos tragos no enfermar al hgado, sino que el riesgo aparece cuando el consumo es excesivo. Generalmente habr algunos signos y sntomas a comienzos del proceso de cicatrizacin del hgado que tendran que alertar para desarrollar mejores hbitos. El alcohol nunca debera consumirse si est tomando acetaminofeno. Pequeas dosis de ambos, consumidos en forma conjunta, pueden causar lesiones irreversibles en el hgado. INSTRUCCIONES PARA EL CUIDADO DOMICILIARIO No existe un tratamiento especfico para la cirrosis; sin embargo hay algunas cosas que usted puede hacer para evitar que la enfermedad empeore.  Descanse todo lo que pueda.  Consuma una dieta normal y bien balanceada. El profesional que lo asiste podr darle algunas indicaciones.  Suplemento vitamnicos que incluyan vitaminas A, K, D y tiamina pueden ayudar.  Una dieta baja en sal, restriccin de agua o diurticos podrn ser necesarios para reducir la retencin de lquidos.  Evite el alcohol. Puede ser extremadamente txico si lo combina con acetaminofeno.  Evite los frmacos que son txicos para el hgado. Entre ellas se incluyen: Isoniacida, metildopa acetaminofeno, esteroides anablicos (frmacos que energizan los msculos), eritromicina, y anticonceptivos orales (pldoras para el control de la natalidad). Realice un control con el profesional que lo asiste para comprobar que los medicamentos que est tomando no sern nocivos.  Podr requerir que se le practiquen pruebas de sangre peridicamente. Siga el consejo del profesional que lo asiste con respecto al momento en que deber Tree surgeon.  La leche de cardo  es un remedio derivado de hierbas que protege al hgado de las toxinas; sin embargo, no ser de [place]. SOLICITE ATENCIN MDICA SI:   Presenta fatiga o debilidad en aumento.  Observa que se le Micron Technology, los pies, las piernas o el abdomen.  Tiene vmitos de sangre de color rojo brillante o semejante a la borra del caf.  Observa sangre en la materia fecal, o las heces se tornan negras y de aspecto alquitranado.  Tiene fiebre.  Ha perdido el apetito, siente nuseas o vmitos.  Desarrolla ictericia.  Se le forman moretones o tiene hemorragias con facilidad.  Alguno de los [employer]a Inc lo preocupaban, comienzan a Copy. Document Released: 09/14/2005 Document Revised: 12/07/2011 [facility] Patient Information 2015 [place]. This information is not intended to replace advice given to you by your health care provider. Make sure you discuss any questions you have with your health care provider.

## 2014-04-25 NOTE — Assessment & Plan Note (Signed)
Likely secondary to fatty liver. Transaminases normal recently. Will hold off on serologies for autoimmune/PBC unless spike in LFTs. Next [place] of abdomen due in 6 months. Needs EGD for variceal screening, along with colonoscopy for routine screening. Needs Hep A and B vaccination. Mild lower extremity edema noted, but she admits to using large amounts of salt. Will incorporate dietary and behavior modification. If no improvement with this, will need low dose diuretic therapy. No appreciable ascites on exam.   Proceed with TCS/EGD with Dr. Gala Romney in near future: the risks, benefits, and alternatives have been discussed with the patient in detail. The patient states understanding and desires to proceed. Patient will contact us when she has obtained insurance Recheck CBC, iron, ferritin now.

## 2014-04-26 NOTE — Progress Notes (Signed)
No PCP 

## 2014-05-02 NOTE — Progress Notes (Signed)
Received outside labs. Hgb 14.7. Platelet count not obtained due to clumps on smear. Iron 111. Ferritin 100. TIBC 281.   Tbili 1.2, AP 132, AST 49, ALT 23, Albumin 2.8.

## 2014-05-09 LAB — CBC
ALBUMIN: 2.8
ALT: 23
AST: 49 U/L
Alkaline Phosphatase: 132 U/L
FERRITIN: 100
HEMOGLOBIN: 14.7 g/dL
TIBC: 281
Total Bilirubin: 1.2 mg/dL

## 2014-05-22 ENCOUNTER — Other Ambulatory Visit (HOSPITAL_COMMUNITY): Payer: Self-pay | Admitting: *Deleted

## 2014-05-22 DIAGNOSIS — N6320 Unspecified lump in the left breast, unspecified quadrant: Secondary | ICD-10-CM

## 2014-06-05 ENCOUNTER — Encounter (HOSPITAL_COMMUNITY): Payer: Self-pay

## 2014-06-19 ENCOUNTER — Other Ambulatory Visit (HOSPITAL_COMMUNITY): Payer: Self-pay | Admitting: *Deleted

## 2014-06-19 ENCOUNTER — Ambulatory Visit (HOSPITAL_COMMUNITY)
Admission: RE | Admit: 2014-06-19 | Discharge: 2014-06-19 | Disposition: A | Discharge status: Home / Self Care | Payer: PRIVATE HEALTH INSURANCE | Source: Ambulatory Visit | Attending: *Deleted | Admitting: *Deleted

## 2014-06-19 ENCOUNTER — Ambulatory Visit (INDEPENDENT_AMBULATORY_CARE_PROVIDER_SITE_OTHER): Payer: PRIVATE HEALTH INSURANCE | Admitting: Gastroenterology

## 2014-06-19 ENCOUNTER — Encounter: Payer: Self-pay | Admitting: Gastroenterology

## 2014-06-19 ENCOUNTER — Other Ambulatory Visit: Payer: Self-pay

## 2014-06-19 VITALS — BP 126/75 | HR 66 | Temp 96.8°F | Ht 60.0 in | Wt 226.4 lb

## 2014-06-19 DIAGNOSIS — Z1211 Encounter for screening for malignant neoplasm of colon: Secondary | ICD-10-CM | POA: Diagnosis not present

## 2014-06-19 DIAGNOSIS — K7460 Unspecified cirrhosis of liver: Secondary | ICD-10-CM

## 2014-06-19 DIAGNOSIS — N63 Unspecified lump in unspecified breast: Secondary | ICD-10-CM | POA: Insufficient documentation

## 2014-06-19 DIAGNOSIS — N6320 Unspecified lump in the left breast, unspecified quadrant: Secondary | ICD-10-CM

## 2014-06-19 MED ORDER — PEG-KCL-NACL-NASULF-NA ASC-C 100 G PO SOLR: 1.0000 | ORAL | Status: DC

## 2014-06-19 NOTE — Patient Instructions (Signed)
We have scheduled you for a colonoscopy and upper endoscopy with Dr. Gala Romney in the near future.   Further recommendations to follow!  Please complete the Hepatitis A and B vaccination with your primary care doctor.

## 2014-06-19 NOTE — Assessment & Plan Note (Signed)
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 Serologies for autoimmune/PBC etiology have not been drawn; recent LFTs normal. If LFTs fluctuate, will work-up further. Needs Hep A/B vaccinations. Next [place] of abdomen due in Jan 2016. Needs EGD for variceal screening.   Proceed with upper endoscopy in the near future with Dr. Gala Romney. The risks, benefits, and alternatives have been discussed in detail with patient. They have stated understanding and desire to proceed.

## 2014-06-19 NOTE — Assessment & Plan Note (Signed)
No prior lower GI evaluation. No concerning symptoms. Proceed with TCS with Dr. Gala Romney in near future: the risks, benefits, and alternatives have been discussed with the patient in detail. The patient states understanding and desires to proceed.

## 2014-06-19 NOTE — Progress Notes (Signed)
    Referring Provider: No ref. provider found Primary Care Physician:  No PCP Per Patient Primary GI: Dr. Gala Romney   Chief Complaint  Patient presents with  . Colonoscopy    HPI:   Dawn Beard presents today with recently new diagnosis of cirrhosis, likely NASH. Viral markers normal. Outside LFTs normal. Next [place] of abdomen due in Jan 2016. Still needs Hep A/B vaccinations. Needs initial screening colonoscopy and EGD for variceal screening. No prior GI evaluation in past. Denies any abdominal pain, N/V, upper GI symptoms, lower GI symptoms of concern. No mental status changes or confusion.    Past Medical History  Diagnosis Date  . Stroke   . Arthritis     Past Surgical History  Procedure Laterality Date  . Appendectomy    . External ear surgery    . Abdominal hysterectomy      Current Outpatient Prescriptions  Medication Sig Dispense Refill  . NON FORMULARY Pt is on an ASA daily/ not sure strength      . peg 3350 powder (MOVIPREP) 100 G SOLR Take 1 kit (200 g total) by mouth as directed.  1 kit  0   No current facility-administered medications for this visit.    Allergies as of 06/19/2014 - Review Complete 06/19/2014  Allergen Reaction Noted  . Shrimp [shellfish allergy]  04/08/2014    Family History  Problem Relation Age of Onset  . Liver cancer Mother   . Colon cancer Neg Hx     History   Social History  . Marital Status: Single    Spouse Name: N/A    Number of Children: N/A  . Years of Education: N/A   Social History Main Topics  . Smoking status: Never Smoker   . Smokeless tobacco: Never Used     Comment: Never smoked  . Alcohol Use: No  . Drug Use: No  . Sexual Activity: None   Other Topics Concern  . None   Social History Narrative  . None    Review of Systems: As mentioned in HPI  Physical Exam: BP 126/75  Pulse 66  Temp(Src) 96.8 F (36 C) (Oral)  Ht 5' (1.524 m)  Wt 226 lb 6.4 oz (102.694 kg)  BMI 44.22 kg/m2 General:    Alert and oriented. No distress noted. Pleasant and cooperative.  Head:  Normocephalic and atraumatic. Eyes:  Conjuctiva clear without scleral icterus. Mouth:  Oral mucosa pink and moist. Good dentition. No lesions. Heart:  S1, S2 present without murmurs, rubs, or gallops. Regular rate and rhythm. Abdomen:  +BS, soft, obese with large AP diameter and non-distended. No rebound or guarding. Unable to appreciate HSM due to large AP diameter.  Msk:  Symmetrical without gross deformities. Normal posture. Extremities:  Trace ankle edema Neurologic:  Alert and  oriented x4;  grossly normal neurologically. Skin:  Intact without significant lesions or rashes. Psych:  Alert and cooperative. Normal mood and affect.  Lab Results  Component Value Date   WBC 10.2 04/10/2014   HGB 14.7 04/30/2014   HCT 34.6* 04/10/2014   MCV 103.0* 04/10/2014   PLT 90* 04/10/2014   Lab Results  Component Value Date   ALT 26 04/11/2014   AST 49 04/30/2014   ALKPHOS 132 04/30/2014   BILITOT 1.2 04/30/2014

## 2014-06-19 NOTE — Progress Notes (Signed)
No pcp or referring per patient

## 2014-06-26 ENCOUNTER — Ambulatory Visit (HOSPITAL_COMMUNITY)
Admission: RE | Admit: 2014-06-26 | Discharge: 2014-06-26 | Disposition: A | Discharge status: Home / Self Care | Payer: PRIVATE HEALTH INSURANCE | Source: Ambulatory Visit | Attending: *Deleted | Admitting: *Deleted

## 2014-06-26 ENCOUNTER — Encounter (HOSPITAL_COMMUNITY): Payer: Self-pay

## 2014-06-26 ENCOUNTER — Other Ambulatory Visit (HOSPITAL_COMMUNITY): Payer: Self-pay | Admitting: *Deleted

## 2014-06-26 DIAGNOSIS — N63 Unspecified lump in unspecified breast: Secondary | ICD-10-CM | POA: Diagnosis present

## 2014-06-26 DIAGNOSIS — IMO0002 Reserved for concepts with insufficient information to code with codable children: Secondary | ICD-10-CM

## 2014-06-26 DIAGNOSIS — N6320 Unspecified lump in the left breast, unspecified quadrant: Secondary | ICD-10-CM

## 2014-06-26 DIAGNOSIS — R229 Localized swelling, mass and lump, unspecified: Principal | ICD-10-CM

## 2014-06-26 DIAGNOSIS — D249 Benign neoplasm of unspecified breast: Secondary | ICD-10-CM | POA: Diagnosis not present

## 2014-06-26 MED ORDER — LIDOCAINE HCL (PF) 2 % IJ SOLN
INTRAMUSCULAR | Status: AC
  Filled 2014-06-26: qty 10

## 2014-06-26 NOTE — Discharge Instructions (Signed)
BIOPSIA DE MAMA (Breast Biopsy) La biopsia de mama es un procedimiento en el cual se extrae Truddie Coco de tejido de la mama. Luego se observa el tejido en el microscopio para detectar clulas cancerosas.  ANTES DEL PROCEDIMIENTO  Haga arreglos para que alguien la lleve a su casa despus de la prueba.  No fume durante las 2semanas anteriores al estudio. Si fuma, abandone el hbito.  No consuma alcohol durante las 24horas anteriores al [place].  Use un buen sostn para el procedimiento. PROCEDIMIENTO  Podrn administrarle uno de los siguientes medicamentos:  Un medicamento para adormecer la zona de la mama (anestesia local).  Un medicamento que la har dormir (anestesia general). Hay diferentes tipos de biopsia de mama. Entre los que se incluyen:  Aspiracin con [employer].  Se inserta una aguja en el ndulo de la mama.  Con la aguja se extrae lquido y clulas del ndulo.  A fin de Research scientist (physical sciences) exacto para insertar la aguja, se utiliza una ecografa.  Biopsia con Oletta Lamas.  Se inserta una aguja en el ndulo de la mama.  La aguja se introduce en la mama entre 3 y 6 veces.  Se retira tejido mamario.  Para encontrar el lugar exacto para insertar la aguja se utiliza una ecografa o una radiografa.  Biopsia estereotctica:  Se utilizan equipos radiogrficos y [place] computadora para Physiological scientist imgenes de la tumoracin [place].  Con la computadora se encuentra el sitio exacto dnde colocar la aguja en la mama.  Se extraen muestras de tejido.  Biopsia asistida por vaco.  Se realiza un pequeo corte (incisin) en la mama.  Un equipo para biopsia se pasa a travs de la incisin dentro del tejido [place].  El equipo extrae tejido [employer] anormal.  En general puede extraerse Truddie Coco mayor de tejido.  No se necesitan puntos de sutura.  Biopsia con aguja gruesa guiada con ecografa.  Con una ecografa se gua la aguja hacia la zona anormal de la  mama.  Se hace una incisin en la mama. La aguja se inserta en el ndulo de la mama.  Se extraen muestras de tejido.  Biopsia abierta  Se hace una incisin grande en la mama.  El mdico intentar extirpar todo el tumor de la mama o todo lo que pueda. Todas las muestras de tejidos, lquidos o clulas se observan en el microscopio.  DESPUS DEL PROCEDIMIENTO  La llevarn a una zona de recuperacin. Podr volver a su casa una vez que se encuentre bien y no tenga problemas.  Podr notar hematomas en la mama. Esto es normal.  Pueden colocarle un vendaje (apsito) compresivo en la mama durante 24a 48horas. Este vendaje se envuelve firmemente alrededor del trax. Ayuda a evitar la acumulacin de lquido debajo de los tejidos. Document Released: 03/15/2012 Document Revised: 01/29/2014 [facility] Patient Information 2015 [place]. This information is not intended to replace advice given to you by your health care provider. Make sure you discuss any questions you have with your health care provider.  Biopsia de mama - Cuidados posteriores  (Breast Biopsy, Care After)  Siga estas instrucciones durante las prximas semanas. Estas indicaciones le proporcionan informacin general acerca de cmo deber cuidarse despus del procedimiento. El mdico tambin podr darle instrucciones ms especficas. El tratamiento ha sido planificado segn las prcticas mdicas actuales, pero en algunos casos pueden ocurrir problemas. Comunquese con el mdico si tiene algn problema o tiene preguntas despus del procedimiento. [place]  tome medicamentos de venta libre o recetados para Conservation officer, historic buildings, Tree surgeon o [place], segn las indicaciones del mdico.  No tome aspirina. Puede ocasionar hemorragias.  Mantenga las suturas (puntos) secos cuando se bae.  Proteja la zona de la biopsia. No deje que la zona se inflame.  Evite las actividades que podran tironear y abrir el  sitio de la biopsia hasta que su mdico la autorice. Aqu se incluye elongacin, estiramientos, actividad fsica, deportes o levantar pesos de ms de 3 libras (1.3 kg).  Siga con su dieta habitual.  Use un buen sostn de soporte durante el tiempo que le indique su mdico.  Cambie los apsitos vendajes tal como le indic su mdico.  No beba alcohol si toma analgsicos.  Concurra puntualmente a las citas de control con el mdico. Consulte la fecha en que los resultados estarn disponibles. Asegrese de [employer]. SOLICITE ATENCIN MDICA SI:   Presenta enrojecimiento, hinchazn o aumento del dolor en el sitio de la biopsia.  Advierte un olor ftido que proviene del sitio de la biopsia o del vendaje.  El sitio d la biopsia se abre despus de que le han retirado los puntos (suturas), las grapas o la [place].  Tiene una erupcin.  Necesita medicamentos ms fuertes. SOLICITE ATENCIN MDICA DE INMEDIATO SI:   Tiene fiebre.  Aumenta el sangrado (ms de una pequea mancha) en el lugar de la biopsia.  Tiene dificultad para respirar.  Tiene pus en el sitio de la biopsia. ASEGRESE DE QUE:   Comprende estas instrucciones.  Controlar su enfermedad.  Solicitar ayuda de inmediato si no mejora o si empeora. Document Released: 06/08/2012 [facility] Patient Information 2015 [place]. This information is not intended to replace advice given to you by your health care provider. Make sure you discuss any questions you have with your health care provider.

## 2014-06-29 ENCOUNTER — Encounter (HOSPITAL_COMMUNITY): Payer: Self-pay | Admitting: Pharmacy Technician

## 2014-07-04 ENCOUNTER — Encounter (HOSPITAL_COMMUNITY)
Admission: RE | Disposition: A | Discharge status: Home / Self Care | Payer: Self-pay | Source: Ambulatory Visit | Attending: Internal Medicine

## 2014-07-04 ENCOUNTER — Encounter (HOSPITAL_COMMUNITY): Payer: Self-pay | Admitting: *Deleted

## 2014-07-04 ENCOUNTER — Ambulatory Visit (HOSPITAL_COMMUNITY)
Admission: RE | Admit: 2014-07-04 | Discharge: 2014-07-04 | Disposition: A | Discharge status: Home / Self Care | Payer: PRIVATE HEALTH INSURANCE | Source: Ambulatory Visit | Attending: Internal Medicine | Admitting: Internal Medicine

## 2014-07-04 DIAGNOSIS — K259 Gastric ulcer, unspecified as acute or chronic, without hemorrhage or perforation: Secondary | ICD-10-CM

## 2014-07-04 DIAGNOSIS — K7460 Unspecified cirrhosis of liver: Secondary | ICD-10-CM | POA: Insufficient documentation

## 2014-07-04 DIAGNOSIS — Z91013 Allergy to seafood: Secondary | ICD-10-CM | POA: Insufficient documentation

## 2014-07-04 DIAGNOSIS — M1990 Unspecified osteoarthritis, unspecified site: Secondary | ICD-10-CM | POA: Insufficient documentation

## 2014-07-04 DIAGNOSIS — Z1212 Encounter for screening for malignant neoplasm of rectum: Secondary | ICD-10-CM

## 2014-07-04 DIAGNOSIS — Z8673 Personal history of transient ischemic attack (TIA), and cerebral infarction without residual deficits: Secondary | ICD-10-CM | POA: Insufficient documentation

## 2014-07-04 DIAGNOSIS — Z1211 Encounter for screening for malignant neoplasm of colon: Secondary | ICD-10-CM | POA: Insufficient documentation

## 2014-07-04 HISTORY — PX: COLONOSCOPY: SHX5424

## 2014-07-04 HISTORY — PX: ESOPHAGOGASTRODUODENOSCOPY: SHX5428

## 2014-07-04 SURGERY — COLONOSCOPY
Anesthesia: Moderate Sedation

## 2014-07-04 MED ORDER — MEPERIDINE HCL 100 MG/ML IJ SOLN
INTRAMUSCULAR | Status: AC
  Filled 2014-07-04: qty 2

## 2014-07-04 MED ORDER — MIDAZOLAM HCL 5 MG/5ML IJ SOLN
INTRAMUSCULAR | Status: DC | PRN
  Administered 2014-07-04 (×2): 1 mg via INTRAVENOUS
  Administered 2014-07-04: 2 mg via INTRAVENOUS
  Administered 2014-07-04 (×3): 1 mg via INTRAVENOUS

## 2014-07-04 MED ORDER — ONDANSETRON HCL 4 MG/2ML IJ SOLN
INTRAMUSCULAR | Status: AC
  Filled 2014-07-04: qty 2

## 2014-07-04 MED ORDER — STERILE WATER FOR IRRIGATION IR SOLN
Status: DC | PRN
  Administered 2014-07-04: 14:00:00

## 2014-07-04 MED ORDER — LIDOCAINE VISCOUS 2 % MT SOLN
OROMUCOSAL | Status: AC
  Filled 2014-07-04: qty 15

## 2014-07-04 MED ORDER — ONDANSETRON HCL 4 MG/2ML IJ SOLN
INTRAMUSCULAR | Status: DC | PRN
  Administered 2014-07-04: 4 mg via INTRAVENOUS

## 2014-07-04 MED ORDER — MEPERIDINE HCL 100 MG/ML IJ SOLN
INTRAMUSCULAR | Status: DC | PRN
  Administered 2014-07-04 (×2): 25 mg via INTRAVENOUS
  Administered 2014-07-04: 50 mg via INTRAVENOUS

## 2014-07-04 MED ORDER — LIDOCAINE VISCOUS 2 % MT SOLN
OROMUCOSAL | Status: DC | PRN
  Administered 2014-07-04: 3 mL via OROMUCOSAL

## 2014-07-04 MED ORDER — MIDAZOLAM HCL 5 MG/5ML IJ SOLN
INTRAMUSCULAR | Status: AC
  Filled 2014-07-04: qty 10

## 2014-07-04 MED ORDER — SODIUM CHLORIDE 0.9 % IV SOLN
INTRAVENOUS | Status: DC
  Administered 2014-07-04: 1000 mL via INTRAVENOUS

## 2014-07-04 NOTE — Op Note (Signed)
[place], [id number]   COLONOSCOPY PROCEDURE REPORT  PATIENT: Dawn Beard, Dawn Beard  MR#: [id number] BIRTHDATE: January 24, 1956 , 39  yrs. old GENDER: female ENDOSCOPIST: R.  Garfield Cornea, MD FACP [facility] REFERRED BY:   none PROCEDURE DATE:  July 21, 2014 PROCEDURE:   Colonoscopy, screening INDICATIONS:average risk for colorectal cancer. MEDICATIONS: Versed 7 mg IV and Demerol 100 mg IV in divided doses. Zofran 4 mg IV. ASA CLASS:       Class III  CONSENT: The risks, benefits, alternatives and imponderables including but not limited to bleeding, perforation as well as the possibility of a missed lesion have been reviewed.  The potential for biopsy, lesion removal, etc. have also been discussed. Questions have been answered.  All parties agreeable.  Please see the history and physical in the medical record for more information.  DESCRIPTION OF PROCEDURE:   After the risks benefits and alternatives of the procedure were thoroughly explained, informed consent was obtained.  The digital rectal exam      The EG-2990i ([id number])  endoscope was introduced through the anus and advanced to the   . No adverse events experienced.   The quality of the prep was adequate.  The instrument was then slowly withdrawn as the colon was fully examined.      COLON FINDINGS: Normal-appearing rectal and colonic mucosa. Retroflexed views revealed no abnormalities. .  Withdrawal time=8 minutes 0 seconds.  The scope was withdrawn and the procedure completed. COMPLICATIONS: There were no immediate complications.  ENDOSCOPIC IMPRESSION: Normal colonoscopy   .Marland Kitchen  RECOMMENDATIONS: Repeat colonoscopy in 10 years for screening purposes. See EGD report.  eSigned:  R. Garfield Cornea, MD Rosalita Chessman [facility] 07-21-14 2:46 PM   cc:  CPT CODES: ICD CODES:  The ICD and CPT codes recommended by this software are interpretations from the data that the clinical staff has  captured with the software.  The verification of the translation of this report to the ICD and CPT codes and modifiers is the sole responsibility of the health care institution and practicing physician where this report was generated.  [place]. will not be held responsible for the validity of the ICD and CPT codes included on this report.  AMA assumes no liability for data contained or not contained herein. CPT is a Designer, television/film set of the [employer].

## 2014-07-04 NOTE — Interval H&P Note (Signed)
History and Physical Interval Note:  07/04/2014 1:52 PM  Dawn Beard  has presented today for surgery, with the diagnosis of cirrhosis/screening TCS  The various methods of treatment have been discussed with the patient and family. After consideration of risks, benefits and other options for treatment, the patient has consented to  Procedure(s) with comments: COLONOSCOPY (N/A) - 1:30 ESOPHAGOGASTRODUODENOSCOPY (EGD) (N/A) as a surgical intervention .  The patient's history has been reviewed, patient examined, no change in status, stable for surgery.  I have reviewed the patient's chart and labs.  Questions were answered to the patient's satisfaction.     [name]  No change. Screening EGD (varices) and colonoscopy( colorectal cancer Average risk) . Reviewed with patient and son.The risks, benefits, limitations, imponderables and alternatives regarding both EGD and colonoscopy have been reviewed with the patient. Questions have been answered. All parties agreeable.

## 2014-07-04 NOTE — Op Note (Signed)
[facility] [address] [place], [address]   ENDOSCOPY PROCEDURE REPORT  PATIENT: Dawn Beard, Dawn Beard  MR#: [id number] BIRTHDATE: May 04, 1956 , 41  yrs. old GENDER: female ENDOSCOPIST: R.  Garfield Cornea, MD FACP [facility] REFERRED BY:     none reported PROCEDURE DATE:  29-Jul-2014 PROCEDURE:  EGD w/ biopsy INDICATIONS:  new diagnosis of cirrhosis; screen for esophageal varices. MEDICATIONS: Versed 4 mg the and Demerol 75 mg IV in divided doses. Zofran 4 mg IV.  Xylocaine gel. ASA CLASS:      Class III  CONSENT: The risks, benefits, limitations, alternatives and imponderables have been discussed.  The potential for biopsy, esophogeal dilation, etc. have also been reviewed.  Questions have been answered.  All parties agreeable.  Please see the history and physical in the medical record for more information.  DESCRIPTION OF PROCEDURE: After the risks benefits and alternatives of the procedure were thoroughly explained, informed consent was obtained.  The EG-2990i ([id number]) endoscope was introduced through the mouth and advanced to the second portion of the duodenum , limited by Without limitations. The instrument was slowly withdrawn as the mucosa was fully examined.    Normal esophagus/no esophageal varices.  Stomach empty.  The proximal stomach somewhat congested and mottled some "snake skinning" of the mucosa but this was patchy.  No gastric varices, ulcer or infiltrating process or other abnormality.  Patent pylorus.  Abnormal first and second portion of the duodenum.  The abnormal proximal gastric mucosa was biopsied for histologic study.         The scope was then withdrawn from the patient and the procedure completed.  COMPLICATIONS: There were no immediate complications.  ENDOSCOPIC IMPRESSION: Normal esophagus. Nonspecific abnormal-appearing gastric mucosa could represent portal gastropathy but nonspecific-status  post biopsy    RECOMMENDATIONS: Followup on pathology. I recommend this nice lady have another screening EGD in 2-3 years. See colonoscopy report.  REPEAT EXAM:  eSigned:  R. Garfield Cornea, MD Rosalita Chessman [facility] July 29, 2014 2:21 PM    CC:  CPT CODES: ICD CODES:  The ICD and CPT codes recommended by this software are interpretations from the data that the clinical staff has captured with the software.  The verification of the translation of this report to the ICD and CPT codes and modifiers is the sole responsibility of the health care institution and practicing physician where this report was generated.  [place]. will not be held responsible for the validity of the ICD and CPT codes included on this report.  AMA assumes no liability for data contained or not contained herein. CPT is a Designer, television/film set of the [employer].  PATIENT NAME:  [name], [name] MR#: [id number]

## 2014-07-04 NOTE — Discharge Instructions (Addendum)
Colonoscopy Discharge Instructions  Read the instructions outlined below and refer to this sheet in the next few weeks. These discharge instructions provide you with general information on caring for yourself after you leave the hospital. Your doctor may also give you specific instructions. While your treatment has been planned according to the most current medical practices available, unavoidable complications occasionally occur. If you have any problems or questions after discharge, call Dr. Gala Romney at [phone]. ACTIVITY  You may resume your regular activity, but move at a slower pace for the next 24 hours.   Take frequent rest periods for the next 24 hours.   Walking will help get rid of the air and reduce the bloated feeling in your belly (abdomen).   No driving for 24 hours (because of the medicine (anesthesia) used during the test).    Do not sign any important legal documents or operate any machinery for 24 hours (because of the anesthesia used during the test).  NUTRITION  Drink plenty of fluids.   You may resume your normal diet as instructed by your doctor.   Begin with a light meal and progress to your normal diet. Heavy or fried foods are harder to digest and may make you feel sick to your stomach (nauseated).   Avoid alcoholic beverages for 24 hours or as instructed.  MEDICATIONS  You may resume your normal medications unless your doctor tells you otherwise.  WHAT YOU CAN EXPECT TODAY  Some feelings of bloating in the abdomen.   Passage of more gas than usual.   Spotting of blood in your stool or on the toilet paper.  IF YOU HAD POLYPS REMOVED DURING THE COLONOSCOPY:  No aspirin products for 7 days or as instructed.   No alcohol for 7 days or as instructed.   Eat a soft diet for the next 24 hours.  FINDING OUT THE RESULTS OF YOUR TEST Not all test results are available during your visit. If your test results are not back during the visit, make an appointment  with your caregiver to find out the results. Do not assume everything is normal if you have not heard from your caregiver or the medical facility. It is important for you to follow up on all of your test results.  SEEK IMMEDIATE MEDICAL ATTENTION IF:  You have more than a spotting of blood in your stool.   Your belly is swollen (abdominal distention).   You are nauseated or vomiting.   You have a temperature over 101.  You have abdominal pain or discomfort that is severe or gets worse throughout the day.    EGD Discharge instructions Please read the instructions outlined below and refer to this sheet in the next few weeks. These discharge instructions provide you with general information on caring for yourself after you leave the hospital. Your doctor may also give you specific instructions. While your treatment has been planned according to the most current medical practices available, unavoidable complications occasionally occur. If you have any problems or questions after discharge, please call your doctor. ACTIVITY You may resume your regular activity but move at a slower pace for the next 24 hours.  Take frequent rest periods for the next 24 hours.  Walking will help expel (get rid of) the air and reduce the bloated feeling in your abdomen.  No driving for 24 hours (because of the anesthesia (medicine) used during the test).  You may shower.  Do not sign any important legal documents or operate any  machinery for 24 hours (because of the anesthesia used during the test).  NUTRITION Drink plenty of fluids.  You may resume your normal diet.  Begin with a light meal and progress to your normal diet.  Avoid alcoholic beverages for 24 hours or as instructed by your caregiver.  MEDICATIONS You may resume your normal medications unless your caregiver tells you otherwise.  WHAT YOU CAN EXPECT TODAY You may experience abdominal discomfort such as a feeling of fullness or gas pains.    FOLLOW-UP Your doctor will discuss the results of your test with you.  SEEK IMMEDIATE MEDICAL ATTENTION IF ANY OF THE FOLLOWING OCCUR: Excessive nausea (feeling sick to your stomach) and/or vomiting.  Severe abdominal pain and distention (swelling).  Trouble swallowing.  Temperature over 101 F (37.8 C).  Rectal bleeding or vomiting of blood.     Repeat EGD in 2-3 years  Repeat colonoscopy in 10 years  Office visit with [place] in 3 months - Jan. 7th at 9:30 AM with Laban Emperor, NP  Further recommendations to follow pending review of pathology report

## 2014-07-04 NOTE — H&P (View-Only) (Signed)
    Referring Provider: No ref. provider found Primary Care Physician:  No PCP Per Patient Primary GI: Dr. Gala Romney   Chief Complaint  Patient presents with  . Colonoscopy    HPI:   Dawn Beard presents today with recently new diagnosis of cirrhosis, likely NASH. Viral markers normal. Outside LFTs normal. Next [place] of abdomen due in Jan 2016. Still needs Hep A/B vaccinations. Needs initial screening colonoscopy and EGD for variceal screening. No prior GI evaluation in past. Denies any abdominal pain, N/V, upper GI symptoms, lower GI symptoms of concern. No mental status changes or confusion.    Past Medical History  Diagnosis Date  . Stroke   . Arthritis     Past Surgical History  Procedure Laterality Date  . Appendectomy    . External ear surgery    . Abdominal hysterectomy      Current Outpatient Prescriptions  Medication Sig Dispense Refill  . NON FORMULARY Pt is on an ASA daily/ not sure strength      . peg 3350 powder (MOVIPREP) 100 G SOLR Take 1 kit (200 g total) by mouth as directed.  1 kit  0   No current facility-administered medications for this visit.    Allergies as of 06/19/2014 - Review Complete 06/19/2014  Allergen Reaction Noted  . Shrimp [shellfish allergy]  04/08/2014    Family History  Problem Relation Age of Onset  . Liver cancer Mother   . Colon cancer Neg Hx     History   Social History  . Marital Status: Single    Spouse Name: N/A    Number of Children: N/A  . Years of Education: N/A   Social History Main Topics  . Smoking status: Never Smoker   . Smokeless tobacco: Never Used     Comment: Never smoked  . Alcohol Use: No  . Drug Use: No  . Sexual Activity: None   Other Topics Concern  . None   Social History Narrative  . None    Review of Systems: As mentioned in HPI  Physical Exam: BP 126/75  Pulse 66  Temp(Src) 96.8 F (36 C) (Oral)  Ht 5' (1.524 m)  Wt 226 lb 6.4 oz (102.694 kg)  BMI 44.22 kg/m2 General:    Alert and oriented. No distress noted. Pleasant and cooperative.  Head:  Normocephalic and atraumatic. Eyes:  Conjuctiva clear without scleral icterus. Mouth:  Oral mucosa pink and moist. Good dentition. No lesions. Heart:  S1, S2 present without murmurs, rubs, or gallops. Regular rate and rhythm. Abdomen:  +BS, soft, obese with large AP diameter and non-distended. No rebound or guarding. Unable to appreciate HSM due to large AP diameter.  Msk:  Symmetrical without gross deformities. Normal posture. Extremities:  Trace ankle edema Neurologic:  Alert and  oriented x4;  grossly normal neurologically. Skin:  Intact without significant lesions or rashes. Psych:  Alert and cooperative. Normal mood and affect.  Lab Results  Component Value Date   WBC 10.2 04/10/2014   HGB 14.7 04/30/2014   HCT 34.6* 04/10/2014   MCV 103.0* 04/10/2014   PLT 90* 04/10/2014   Lab Results  Component Value Date   ALT 26 04/11/2014   AST 49 04/30/2014   ALKPHOS 132 04/30/2014   BILITOT 1.2 04/30/2014

## 2014-07-06 ENCOUNTER — Encounter (HOSPITAL_COMMUNITY): Payer: Self-pay | Admitting: Internal Medicine

## 2014-07-08 ENCOUNTER — Encounter: Payer: Self-pay | Admitting: Internal Medicine

## 2014-09-05 ENCOUNTER — Encounter (HOSPITAL_COMMUNITY): Payer: Self-pay

## 2014-09-05 ENCOUNTER — Encounter (HOSPITAL_COMMUNITY): Payer: PRIVATE HEALTH INSURANCE | Attending: Hematology and Oncology

## 2014-09-05 VITALS — BP 124/68 | HR 76 | Temp 98.4°F | Resp 18 | Ht 61.0 in | Wt 224.0 lb

## 2014-09-05 DIAGNOSIS — K7460 Unspecified cirrhosis of liver: Secondary | ICD-10-CM | POA: Diagnosis not present

## 2014-09-05 DIAGNOSIS — M1990 Unspecified osteoarthritis, unspecified site: Secondary | ICD-10-CM | POA: Diagnosis not present

## 2014-09-05 DIAGNOSIS — D242 Benign neoplasm of left breast: Secondary | ICD-10-CM | POA: Diagnosis not present

## 2014-09-05 DIAGNOSIS — D696 Thrombocytopenia, unspecified: Secondary | ICD-10-CM

## 2014-09-05 DIAGNOSIS — K7581 Nonalcoholic steatohepatitis (NASH): Secondary | ICD-10-CM | POA: Diagnosis not present

## 2014-09-05 DIAGNOSIS — R161 Splenomegaly, not elsewhere classified: Secondary | ICD-10-CM | POA: Diagnosis not present

## 2014-09-05 LAB — CBC WITH DIFFERENTIAL/PLATELET
Basophils Absolute: 0 10*3/uL (ref 0.0–0.1)
Basophils Relative: 1 % (ref 0–1)
EOS PCT: 6 % — AB (ref 0–5)
Eosinophils Absolute: 0.2 10*3/uL (ref 0.0–0.7)
HEMATOCRIT: 43.5 % (ref 36.0–46.0)
Hemoglobin: 14.4 g/dL (ref 12.0–15.0)
Lymphocytes Relative: 35 % (ref 12–46)
Lymphs Abs: 1.2 10*3/uL (ref 0.7–4.0)
MCH: 33.4 pg (ref 26.0–34.0)
MCHC: 33.1 g/dL (ref 30.0–36.0)
MCV: 100.9 fL — ABNORMAL HIGH (ref 78.0–100.0)
Monocytes Absolute: 0.3 10*3/uL (ref 0.1–1.0)
Monocytes Relative: 9 % (ref 3–12)
Neutro Abs: 1.7 10*3/uL (ref 1.7–7.7)
Neutrophils Relative %: 50 % (ref 43–77)
PLATELETS: 68 10*3/uL — AB (ref 150–400)
RBC: 4.31 MIL/uL (ref 3.87–5.11)
RDW: 15.5 % (ref 11.5–15.5)
WBC: 3.4 10*3/uL — AB (ref 4.0–10.5)

## 2014-09-05 LAB — COMPREHENSIVE METABOLIC PANEL
ALK PHOS: 146 U/L — AB (ref 39–117)
ALT: 29 U/L (ref 0–35)
ANION GAP: 12 (ref 5–15)
AST: 58 U/L — ABNORMAL HIGH (ref 0–37)
Albumin: 2.9 g/dL — ABNORMAL LOW (ref 3.5–5.2)
BILIRUBIN TOTAL: 2 mg/dL — AB (ref 0.3–1.2)
BUN: 10 mg/dL (ref 6–23)
CO2: 20 mEq/L (ref 19–32)
CREATININE: 0.6 mg/dL (ref 0.50–1.10)
Calcium: 10.1 mg/dL (ref 8.4–10.5)
Chloride: 112 mEq/L (ref 96–112)
GFR calc Af Amer: >90 mL/min (ref >90)
GFR calc non Af Amer: >90 mL/min (ref >90)
Glucose, Bld: 93 mg/dL (ref 70–99)
POTASSIUM: 3.9 meq/L (ref 3.7–5.3)
Sodium: 144 mEq/L (ref 137–147)
TOTAL PROTEIN: 7.6 g/dL (ref 6.0–8.3)

## 2014-09-05 LAB — VITAMIN B12: VITAMIN B 12: 1143 pg/mL — AB (ref 211–911)

## 2014-09-05 LAB — FOLATE: Folate: 15.2 ng/mL

## 2014-09-05 LAB — LACTATE DEHYDROGENASE: LDH: 235 U/L (ref 94–250)

## 2014-09-05 NOTE — Progress Notes (Signed)
Dawn Beard presented for [employer]. Labs per MD order drawn via Peripheral Line 23 gauge needle inserted in left antecubital.  Good blood return present. Procedure without incident.  Needle removed intact. Patient tolerated procedure well.

## 2014-09-05 NOTE — Patient Instructions (Signed)
[facility] Discharge Instructions  RECOMMENDATIONS MADE BY THE CONSULTANT AND ANY TEST RESULTS WILL BE SENT TO YOUR REFERRING PHYSICIAN.  EXAM FINDINGS BY THE PHYSICIAN TODAY AND SIGNS OR SYMPTOMS TO REPORT TO CLINIC OR PRIMARY PHYSICIAN: Exam and findings as discussed by Dr.Formanek.  MEDICATIONS PRESCRIBED:  Continue as prescribed.  INSTRUCTIONS/FOLLOW-UP: Lab work today. We will make a referral for a dietician consult, someone will contact you via phone. Return to clinic in 3 weeks for lab work and Md appointment.  Thank you for choosing [facility] to provide your oncology and hematology care.  To afford each patient quality time with our providers, please arrive at least 15 minutes before your scheduled appointment time.  With your help, our goal is to use those 15 minutes to complete the necessary work-up to ensure our physicians have the information they need to help with your evaluation and healthcare recommendations.    Effective January 1st, 2014, we ask that you re-schedule your appointment with our physicians should you arrive 10 or more minutes late for your appointment.  We strive to give you quality time with our providers, and arriving late affects you and other patients whose appointments are after yours.    Again, thank you for choosing [facility].  Our hope is that these requests will decrease the amount of time that you wait before being seen by our physicians.       _____________________________________________________________  Should you have questions after your visit to [facility], please contact our office at [phone] between the hours of 8:30 a.m. and 4:30 p.m.  Voicemails left after 4:30 p.m. will not be returned until the following business day.  For prescription refill requests, have your pharmacy contact our office with your prescription refill request.     _______________________________________________________________  We hope that we have given you very good care.  You may receive a patient satisfaction survey in the mail, please complete it and return it as soon as possible.  We value your feedback!  _______________________________________________________________  Have you asked about our STAR program?  STAR stands for Survivorship Training and Rehabilitation, and this is a nationally recognized cancer care program that focuses on survivorship and rehabilitation.  Cancer and cancer treatments may cause problems, such as, pain, making you feel tired and keeping you from doing the things that you need or want to do. Cancer rehabilitation can help. Our goal is to reduce these troubling effects and help you have the best quality of life possible.  You may receive a survey from a nurse that asks questions about your current state of health.  Based on the survey results, all eligible patients will be referred to the [facility] program for an evaluation so we can better serve you!  A frequently asked questions sheet is available upon request.

## 2014-09-05 NOTE — Progress Notes (Signed)
[facility] A. Barnet Glasgow, M.D.  NEW PATIENT EVALUATION   Name: Dawn Beard Date: 09/05/2014 MRN: [id number] DOB: 1956-08-08  PCP: No PCP Per Patient   REFERRING PHYSICIAN: No ref. provider found   REASON FOR REFERRAL:Thrombocytopenia. Patient was interviewed utilizing interpreter(Sylvia).    HISTORY OF PRESENT ILLNESS:Dawn Beard is a 58 y.o. female who is referred from the free clinic for evaluation of thrombocytopenia. In July 2015 while hospitalized for pyelonephritis, she was found to have cirrhosis of the liver with splenomegaly. 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 She does bruise easily particularly when she wears a clothing or after bumping her extremities. She denies any epistaxis, melena, hematochezia, hematuria, or vaginal bleeding. Appetite is good with no nausea, vomiting, easy satiety, PND, orthopnea, or palpitations.   PAST MEDICAL HISTORY:  has a past medical history of Arthritis and Stroke.     PAST SURGICAL HISTORY: Past Surgical History  Procedure Laterality Date  . Appendectomy    . External ear surgery    . Abdominal hysterectomy    . Colonoscopy N/A 07/04/2014    Procedure: COLONOSCOPY;  Surgeon: Daneil Dolin, MD;  Location[facility];  Service: Endoscopy;  Laterality: N/A;  1:30  . Esophagogastroduodenoscopy N/A 07/04/2014    Procedure: ESOPHAGOGASTRODUODENOSCOPY (EGD);  Surgeon: Daneil Dolin, MD;  Location: [facility];  Service: Endoscopy;  Laterality: N/A;     CURRENT MEDICATIONS: currently has no medications in their medication list.   ALLERGIES: Shrimp   SOCIAL HISTORY:  reports that she has never smoked. She has never used smokeless tobacco. She reports that she does not drink alcohol or use illicit drugs.   FAMILY HISTORY: family history includes Liver cancer in her mother. There is  no history of Colon cancer.    REVIEW OF SYSTEMS:  Other than that discussed above is noncontributory.    PHYSICAL EXAM:  height is 5' 1"  (1.549 m) and weight is 224 lb (101.606 kg). Her oral temperature is 98.4 F (36.9 C). Her blood pressure is 124/68 and her pulse is 76. Her respiration is 18 and oxygen saturation is 100%.    GENERAL:alert, no distress and comfortable. Morbidly obese. SKIN: skin color, texture, turgor are normal, no rashes or significant lesions EYES: normal, Conjunctiva are pink and non-injected, sclera clear OROPHARYNX:no exudate, no erythema and lips, buccal mucosa, and tongue normal  NECK: supple, thyroid normal size, non-tender, without nodularity CHEST: Breast biopsy site in the left is well-healed. Normal AP diameter. LYMPH:  no palpable lymphadenopathy in the cervical, axillary or inguinal LUNGS: clear to auscultation and percussion with normal breathing effort HEART: regular rate & rhythm and no murmurs ABDOMEN:abdomen soft, non-tender and normal bowel sounds. Distended with no appreciable organomegaly, ascites, or CVA tenderness. MUSCULOSKELETALl:no cyanosis of digits, no clubbing or edema  NEURO: alert & oriented x 3 with fluent speech, no focal motor/sensory deficits    LABORATORY DATA:   04/10/2014: WBC 10.2, hemoglobin 11.9, platelets 90,000.   Office Visit on 09/05/2014  Component Date Value Ref Range Status  . WBC 09/05/2014 3.4* 4.0 - 10.5 K/uL Final  . RBC 09/05/2014 4.31  3.87 - 5.11 MIL/uL Final  . Hemoglobin 09/05/2014 14.4  12.0 - 15.0 g/dL Final  . HCT 09/05/2014 43.5  36.0 - 46.0 % Final  . MCV 09/05/2014 100.9* 78.0 - 100.0 fL Final  . MCH  09/05/2014 33.4  26.0 - 34.0 pg Final  . MCHC 09/05/2014 33.1  30.0 - 36.0 g/dL Final  . RDW 09/05/2014 15.5  11.5 - 15.5 % Final  . Platelets 09/05/2014 68* 150 - 400 K/uL Final   Comment: SPECIMEN CHECKED FOR CLOTS CONSISTENT WITH PREVIOUS RESULT   . Neutrophils Relative % 09/05/2014 50  43 -  77 % Final  . Neutro Abs 09/05/2014 1.7  1.7 - 7.7 K/uL Final  . Lymphocytes Relative 09/05/2014 35  12 - 46 % Final  . Lymphs Abs 09/05/2014 1.2  0.7 - 4.0 K/uL Final  . Monocytes Relative 09/05/2014 9  3 - 12 % Final  . Monocytes Absolute 09/05/2014 0.3  0.1 - 1.0 K/uL Final  . Eosinophils Relative 09/05/2014 6* 0 - 5 % Final  . Eosinophils Absolute 09/05/2014 0.2  0.0 - 0.7 K/uL Final  . Basophils Relative 09/05/2014 1  0 - 1 % Final  . Basophils Absolute 09/05/2014 0.0  0.0 - 0.1 K/uL Final  . Sodium 09/05/2014 144  137 - 147 mEq/L Final  . Potassium 09/05/2014 3.9  3.7 - 5.3 mEq/L Final  . Chloride 09/05/2014 112  96 - 112 mEq/L Final  . CO2 09/05/2014 20  19 - 32 mEq/L Final  . Glucose, Bld 09/05/2014 93  70 - 99 mg/dL Final  . BUN 09/05/2014 10  6 - 23 mg/dL Final  . Creatinine, Ser 09/05/2014 0.60  0.50 - 1.10 mg/dL Final  . Calcium 09/05/2014 10.1  8.4 - 10.5 mg/dL Final  . Total Protein 09/05/2014 7.6  6.0 - 8.3 g/dL Final  . Albumin 09/05/2014 2.9* 3.5 - 5.2 g/dL Final  . AST 09/05/2014 58* 0 - 37 U/L Final  . ALT 09/05/2014 29  0 - 35 U/L Final  . Alkaline Phosphatase 09/05/2014 146* 39 - 117 U/L Final  . Total Bilirubin 09/05/2014 2.0* 0.3 - 1.2 mg/dL Final  . GFR calc non Af Amer 09/05/2014 >90  >90 mL/min Final  . GFR calc Af Amer 09/05/2014 >90  >90 mL/min Final   Comment: (NOTE) The eGFR has been calculated using the CKD EPI equation. This calculation has not been validated in all clinical situations. eGFR's persistently <90 mL/min signify possible Chronic Kidney Disease.   . Anion gap 09/05/2014 12  5 - 15 Final  . LDH 09/05/2014 235  94 - 250 U/L Final    Urinalysis    Component Value Date/Time   COLORURINE YELLOW 04/08/2014 [place] 04/08/2014 2254   LABSPEC 1.010 04/08/2014 2254   PHURINE 7.5 04/08/2014 2254   GLUCOSEU NEGATIVE 04/08/2014 2254   HGBUR LARGE* 04/08/2014 2254   BILIRUBINUR NEGATIVE 04/08/2014 2254   KETONESUR NEGATIVE  04/08/2014 2254   PROTEINUR TRACE* 04/08/2014 2254   UROBILINOGEN 2.0* 04/08/2014 2254   NITRITE NEGATIVE 04/08/2014 2254   LEUKOCYTESUR NEGATIVE 04/08/2014 2254      @RADIOGRAPHY : No results found. Pelvis W Contrast   Status: Final result       PACS Images     Show images for CT Abdomen Pelvis W Contrast     Study Result     CLINICAL DATA: Fever. Sepsis of unclear etiology.  EXAM: CT CHEST, ABDOMEN, AND PELVIS WITH CONTRAST  TECHNIQUE: Multidetector CT imaging of the chest, abdomen and pelvis was performed following the standard protocol during bolus administration of intravenous contrast.  CONTRAST: 178m OMNIPAQUE IOHEXOL 300 MG/ML SOLN  COMPARISON: None.  FINDINGS: CT CHEST FINDINGS  Cardiac silhouette is borderline enlarged.  Great vessels are normal in caliber. No mediastinal or hilar masses or enlarged lymph nodes. No neck base or axillary masses or adenopathy.  There is dependent subsegmental atelectasis most evident in the posterior lower lobes. No lung consolidation or edema. No lung nodule or mass. No effusion or pneumothorax.  There is a 15 mm relatively hyper attenuating mass in the left breast.  CT ABDOMEN AND PELVIS FINDINGS  There is an area of wedge-shaped hypoattenuation from the lower pole of the left kidney, which measures 3.5 cm x 3.2 cm in greatest transverse dimension. There is some perinephric stranding and thickening of the perirenal fascia. No discrete fluid collection is seen to suggest an abscess. Findings support pyelonephritis. No convincing right pyelonephritis. No renal mass. No hydronephrosis. Normal ureters. Unremarkable bladder.  Liver shows morphologic changes consistent with cirrhosis with subtle surface nodularity and central volume loss and relative enlargement of the caudate lobe. No liver mass or focal lesion is seen.  Spleen is enlarged measuring 20 cm by 13 cm x 6.9 cm. No splenic mass or  focal lesions.  There are prominent venous collaterals that extend from the splenic hilum to the left renal vein.  There is a trace amount of ascites, noted adjacent to the liver.  Gallbladder and pancreas are unremarkable. No bile duct dilation. No adrenal masses. There are mildly enlarged gastrohepatic ligament lymph nodes, the largest measuring 13.5 mm in short axis. These are nonspecific but presumed reactive, and a common finding in the setting of cirrhosis.  Status post hysterectomy. No pelvic masses. Colon and small bowel are unremarkable. Appendix not visualized. No abnormal fluid collections are seen to suggest an abscess. Small fat containing umbilical hernia also containing a small amount of fluid attenuation. No bowel.  There are degenerative changes throughout the visualized spine as well as an S-shaped scoliosis. No osteoblastic or osteolytic lesions.  IMPRESSION: 1. Findings consistent with pyelonephritis of the lower pole of the left kidney, likely the source of sepsis. No evidence of an abscess. No other findings to suggest a source of infection. 2. Cirrhosis with portal venous hypertension reflected by splenomegaly and splenorenal vascular collaterals. No liver mass or focal lesion. 3. Trace ascites. 4. Small left breast mass. Recommend correlation with mammography if mammography has not been recently performed. 5. Multiple other chronic findings as detailed.   Electronically Signed  By: Lajean Manes M.D.  On: 04/09/2014 11:24    Diagnostic Unilat L   Status: Final result       Study Result     CLINICAL DATA: Post biopsy of a mass in the subareolar left breast.  EXAM: DIAGNOSTIC LEFT MAMMOGRAM POST ULTRASOUND BIOPSY  COMPARISON: Previous exams  FINDINGS: Mammographic images were obtained following ultrasound guided biopsy of an indeterminate mass in the subareolar left breast. A ribbon shaped biopsy marking clip is present  in the targeted region of the mass.  IMPRESSION: Appropriate positioning of ribbon shaped biopsy marking clip post biopsy of a subareolar left breast mass.  Final Assessment: Post Procedure Mammograms for Marker Placement   Electronically Signed  By: Everlean Alstrom M.D.  On: 06/26/2014 12     ENDOSCOPY: 07/04/2014: EGD [facility] [address] [place], [address] ENDOSCOPY PROCEDURE REPORT PATIENT: Dynasia, Kercheval MR#: [id number] BIRTHDATE: September 07, 1956 , 68 yrs. old GENDER: female ENDOSCOPIST: R. Garfield Cornea, MD FACP [facility] REFERRED BY: none reported PROCEDURE DATE: 07/04/2014 PROCEDURE: EGD w/ biopsy INDICATIONS: new diagnosis of cirrhosis; screen for esophageal varices. MEDICATIONS: Versed 4 mg the  and Demerol 75 mg IV in divided doses. Zofran 4 mg IV. Xylocaine gel. ASA CLASS: Class III CONSENT: The risks, benefits, limitations, alternatives and imponderables have been discussed. The potential for biopsy, esophogeal dilation, etc. have also been reviewed. Questions have been answered. All parties agreeable. Please see the history and physical in the medical record for more information. DESCRIPTION OF PROCEDURE: After the risks benefits and alternatives of the procedure were thoroughly explained, informed consent was obtained. The EG-[id number] ([id number]) endoscope was introduced through the mouth and advanced to the second portion of the duodenum , limited by Without limitations. The instrument was slowly withdrawn as the mucosa was fully examined. Normal esophagus/no esophageal varices. Stomach empty. The proximal stomach somewhat congested and mottled some "snake skinning" of the mucosa but this was patchy. No gastric varices, ulcer or infiltrating process or other abnormality. Patent pylorus. Abnormal first and second portion of the duodenum. The abnormal proximal gastric mucosa was biopsied for histologic study. The scope was then withdrawn from  the patient and the procedure completed. COMPLICATIONS: There were no immediate complications. ENDOSCOPIC IMPRESSION: Normal esophagus. Nonspecific abnormal-appearing gastric mucosa could represent portal gastropathy but nonspecific-status post biopsy RECOMMENDATIONS: Followup on pathology. I recommend this nice lady have another screening EGD in 2-3 years. See colonoscopy report. REPEAT EXAM: eSigned: R. Garfield Cornea,   07/04/2014: Colonoscopy [facility] [address] [place], [address] COLONOSCOPY PROCEDURE REPORT PATIENT: Birttany, Dechellis MR#: [id number] BIRTHDATE: 1956-05-15 , 65 yrs. old GENDER: female ENDOSCOPIST: R. Garfield Cornea, MD FACP [facility] REFERRED BY: none PROCEDURE DATE: 07/04/2014 PROCEDURE: Colonoscopy, screening INDICATIONS:average risk for colorectal cancer. MEDICATIONS: Versed 7 mg IV and Demerol 100 mg IV in divided doses. Zofran 4 mg IV. ASA CLASS: Class III CONSENT: The risks, benefits, alternatives and imponderables including but not limited to bleeding, perforation as well as the possibility of a missed lesion have been reviewed. The potential for biopsy, lesion removal, etc. have also been discussed. Questions have been answered. All parties agreeable. Please see the history and physical in the medical record for more information. DESCRIPTION OF PROCEDURE: After the risks benefits and alternatives of the procedure were thoroughly explained, informed consent was obtained. The digital rectal exam The EG-2990i ([id number]) endoscope was introduced through the anus and advanced to the . No adverse events experienced. The quality of the prep was adequate. The instrument was then slowly withdrawn as the colon was fully examined. COLON FINDINGS: Normal-appearing rectal and colonic mucosa. Retroflexed views revealed no abnormalities. . Withdrawal time=8 minutes 0 seconds. The scope was withdrawn and the procedure completed. COMPLICATIONS: There  were no immediate complications. ENDOSCOPIC IMPRESSION: Normal colonoscopy .Marland Kitchen RECOMMENDATIONS: Repeat colonoscopy in 10 years for screening purposes. See EGD report. eSigned: R. Legrand Como   PATHOLOGY:   FINAL for KAYSLEE, FUREY ([id number]) Patient: DANITA, PROUD ACollected: 06/26/2014 Client: [facility] Accession: [id number] Received: 06/26/2014 Everlean Alstrom, MD DOB: 03-07-1956 Age: 72 Gender: F Reported: 06/27/2014 [address] Patient Ph: [phone] MRN #: [id number] [place], [place] [address] Visit #: [id number] Chart #: Phone: Fax: CC: REPORT OF SURGICAL PATHOLOGY FINAL DIAGNOSIS Diagnosis Breast, left, needle core biopsy, subareolar HYALINIZED FIBROADENOMA. Microscopic Comment There is no atypia or malignancy. Dr. Gari Crown agrees. Gretel Acre LI MD Pathologist, Electronic Signature (Case signed 06/27/2014) Specimen Gross and Clinical Information Specimen(s) Obtained: Breast, left, needle core biopsy, subareolar Specimen Clinical Information left breast mass; fibroadenoma vs IDC Gross Received in formalin are two cores of tan yellow to red soft tissue, 1.3 x 0.2  x 0.2 cm and 1.7 x 0.2 x 0.2 cm, submitted in one block. (SW:gt, 06/27/14) Report signed out from the following location(s) Technical Component and Interpretation performed at [place],   FINAL for KYRIAKI, MODER ([id number]) Patient: KEITRA, CARUSONE ACollected: 07/04/2014 Client: [facility] Accession: [id number] Received: 07/05/2014 Manus Rudd DOB: 05-31-1956 Age: 67 Gender: F Reported: 07/06/2014 [address] Patient Ph: [phone] MRN #: [id number] Linna Hoff [place] [address] Visit #: [id number] Chart #: Phone: [phone] Fax: CC: REPORT OF SURGICAL PATHOLOGY FINAL DIAGNOSIS Diagnosis Stomach, biopsy - CHRONIC ATROPHIC GASTRITIS WITH INTESTINAL METAPLASIA. - NO EVIDENCE OF HELICOBACTER PYLORI, DYSPLASIA OR  MALIGNANCY. Aldona Bar MD Pathologist, Electronic Signature (Case signed 07/06/2014) Specimen Gross and Clinical Information Specimen(s) Obtained: Stomach, biopsy Specimen Clinical Information evaluate for H pylori; Pre-op: cirrhosis/screening TCS; Post-op: abnormal gastric mucosa, colon: normal appearing colon Gross Received in formalin are tan, soft tissue fragments that are submitted in toto. Number: 2, Size: 0.3 and 0.4 cm, 1 block. (SW:ds 07/05/14) Stain(s) used in Diagnosis: The following stain(s) were used in diagnosing the case: Warthin-Starry Stain. The control(s) stained appropriately. Report signed out from the following location(s) Technical Component and Interpretation performed at [place],     IMPRESSION:  #1. 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 #2. NASH with cirrhosis and splenomegaly. #3. Fibroadenoma left breast, status post biopsy. #4. Degenerative joint disease. #5. Episode of left pyelonephritis in July 2015, resolved.   PLAN:  #1. Utilizing the interpreter, it was explained that her low platelet count could either be due to a problem with entrapment in the spleen, immune degradation with inability of the bone marrow to keep up with the demand, or primary bone marrow disorder. #2. Additional lab tests were done today. #3. Follow-up in 3 weeks with CBC.  I appreciate the opportunity of sharing in her care.   Doroteo Bradford, MD 09/05/2014 7:10 PM   DISCLAIMER:  This note was dictated with voice recognition softwre.  Similar sounding words can inadvertently be transcribed inaccurately and may not be corrected upon review.

## 2014-09-06 NOTE — Addendum Note (Signed)
Addended by: Berneta Levins on: 09/06/2014 01:20 PM   Modules accepted: Medications

## 2014-09-26 ENCOUNTER — Other Ambulatory Visit (HOSPITAL_COMMUNITY): Payer: Self-pay

## 2014-09-26 DIAGNOSIS — D696 Thrombocytopenia, unspecified: Secondary | ICD-10-CM

## 2014-09-27 LAB — MISCELLANEOUS TEST: Miscellaneous Test: 23912

## 2014-10-01 NOTE — Progress Notes (Deleted)
No PCP Per Patient No address on file  Thrombocytopenia Cirrhosis secondary to NASH Splenomegaly secondary to above  CURRENT THERAPY: Observation  INTERVAL HISTORY: Dawn Beard 59 y.o. female returns for   MEDICAL HISTORY: Past Medical History  Diagnosis Date  . Arthritis   . Stroke     slight    has Fever; Thrombocytopenia; Sepsis; Headache; Morbid obesity; Acute pyelonephritis; Cirrhosis; Breast mass, left; Cirrhosis of liver without mention of alcohol; and Encounter for screening colonoscopy on her problem list.     No history exists.     is allergic to shrimp.  Dawn Beard does not currently have medications on file.  SURGICAL HISTORY: Past Surgical History  Procedure Laterality Date  . Appendectomy    . External ear surgery    . Abdominal hysterectomy    . Colonoscopy N/A 07/04/2014    [id number]  . Esophagogastroduodenoscopy N/A 07/04/2014    [id number]    SOCIAL HISTORY: History   Social History  . Marital Status: Single    Spouse Name: N/A    Number of Children: N/A  . Years of Education: N/A   Occupational History  . Not on file.   Social History Main Topics  . Smoking status: Never Smoker   . Smokeless tobacco: Never Used     Comment: Never smoked  . Alcohol Use: No  . Drug Use: No  . Sexual Activity: Not on file   Other Topics Concern  . Not on file   Social History Narrative    FAMILY HISTORY: Family History  Problem Relation Age of Onset  . Liver cancer Mother   . Colon cancer Neg Hx     ROS  PHYSICAL EXAMINATION  ECOG PERFORMANCE STATUS: {CHL ONC ECOG PS:[phone]}  There were no vitals filed for this visit.  Physical Exam  LABORATORY DATA:  CBC    Component Value Date/Time   WBC 3.4* 09/05/2014 1545   RBC 4.31 09/05/2014 1545   HGB 14.4 09/05/2014 1545   HGB 14.7 04/30/2014   HCT 43.5 09/05/2014 1545   PLT 68* 09/05/2014 1545   MCV 100.9* 09/05/2014 1545   MCH 33.4 09/05/2014 1545   MCHC  33.1 09/05/2014 1545   RDW 15.5 09/05/2014 1545   LYMPHSABS 1.2 09/05/2014 1545   MONOABS 0.3 09/05/2014 1545   EOSABS 0.2 09/05/2014 1545   BASOSABS 0.0 09/05/2014 1545   CMP     Component Value Date/Time   NA 144 09/05/2014 1545   K 3.9 09/05/2014 1545   CL 112 09/05/2014 1545   CO2 20 09/05/2014 1545   GLUCOSE 93 09/05/2014 1545   BUN 10 09/05/2014 1545   CREATININE 0.60 09/05/2014 1545   CALCIUM 10.1 09/05/2014 1545   PROT 7.6 09/05/2014 1545   ALBUMIN 2.9* 09/05/2014 1545   AST 58* 09/05/2014 1545   AST 49 04/30/2014   ALT 29 09/05/2014 1545   ALKPHOS 146* 09/05/2014 1545   ALKPHOS 132 04/30/2014   BILITOT 2.0* 09/05/2014 1545   BILITOT 1.2 04/30/2014   GFRNONAA >90 09/05/2014 1545   GFRAA >90 09/05/2014 1545     PENDING LABS:   RADIOGRAPHIC STUDIES:  No results found.   PATHOLOGY:     ASSESSMENT and THERAPY PLAN:    No problem-specific assessment & plan notes found for this encounter.   All questions were answered. The patient knows to call the clinic with any problems, questions or concerns. We can certainly see the patient much sooner if necessary.  Dawn Beard  Dawn Beard 10/01/2014     This encounter was created in error - please disregard.

## 2014-10-02 ENCOUNTER — Ambulatory Visit (HOSPITAL_COMMUNITY): Payer: PRIVATE HEALTH INSURANCE | Admitting: Hematology & Oncology

## 2014-10-02 ENCOUNTER — Other Ambulatory Visit (HOSPITAL_COMMUNITY): Payer: PRIVATE HEALTH INSURANCE

## 2014-10-04 ENCOUNTER — Ambulatory Visit (INDEPENDENT_AMBULATORY_CARE_PROVIDER_SITE_OTHER): Payer: PRIVATE HEALTH INSURANCE | Admitting: Gastroenterology

## 2014-10-04 ENCOUNTER — Encounter: Payer: Self-pay | Admitting: Gastroenterology

## 2014-10-04 VITALS — BP 121/76 | HR 81 | Temp 97.7°F | Ht 64.0 in | Wt 227.0 lb

## 2014-10-04 DIAGNOSIS — K7460 Unspecified cirrhosis of liver: Secondary | ICD-10-CM

## 2014-10-04 MED ORDER — ONDANSETRON HCL 4 MG PO TABS
4.0000 mg | ORAL_TABLET | Freq: Three times a day (TID) | ORAL | Status: DC | PRN
Start: 2014-10-04 — End: 2014-11-25

## 2014-10-04 MED ORDER — OMEPRAZOLE 20 MG PO CPDR: 20.0000 mg | DELAYED_RELEASE_CAPSULE | Freq: Every day | ORAL | Status: DC

## 2014-10-04 NOTE — Assessment & Plan Note (Signed)
59 year old female with likely NASH cirrhosis, compensated. Now with intermittent nausea and occasional vomiting, early satiety. Currently no PPI and known gastritis. Query secondary to gastritis, possible delayed gastric emptying. Start Prilosec once daily, add Zofran prn. Nutrition consult as per patient request, which is an excellent idea for dietary management. Check INR now. Recent CBC, HFP on file. US abdomen for hepatoma screening. Return in 3 months.

## 2014-10-04 NOTE — Progress Notes (Signed)
Referring Provider: No ref. provider found Primary Care Physician:  No PCP Per Patient Primary GI: Dr. Gala Romney   Chief Complaint  Patient presents with  . Follow-up    HPI:   Dawn Beard is a 59 y.o. female presenting today with a history of likely NASH cirrhosis. Recent EGD on file without varices; needs repeat screening in 2-3 years. Colonoscopy up-to-date and normal. Continues to slowly gain weight.   No abdominal pain. Episode of vomiting once every 2-3 days. Gets nauseated with little bits of food. Present about a month. Early satiety. No typical reflux symptoms. Not on a PPI. Eating may precipitate nausea. Mild lower extremity edema with walking a lot. Continues to gain weight. Would like to see a nutritionist.   Past Medical History  Diagnosis Date  . Arthritis   . Stroke     slight  . 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 Appendectomy    . External ear surgery    . Abdominal hysterectomy    . Colonoscopy N/A 07/04/2014    Dr. Vivi Ferns  . Esophagogastroduodenoscopy N/A 07/04/2014    Dr. Rourk:normal esophagus, query portal gastropathy, chronic atrophic gastritis with intestinal metaplasia, screening EGD in 2-3 years    Current Outpatient Prescriptions  Medication Sig Dispense Refill  . meloxicam (MOBIC) 15 MG tablet Take 15 mg by mouth daily.    . traMADol (ULTRAM) 50 MG tablet Take 50 mg by mouth every 6 (six) hours as needed.     No current facility-administered medications for this visit.    Allergies as of 10/04/2014 - Review Complete 10/04/2014  Allergen Reaction Noted  . Shrimp [shellfish allergy] Itching and Rash 04/08/2014    Family History  Problem Relation Age of Onset  . Liver cancer Mother   . Colon cancer Neg Hx     History   Social History  . Marital Status: Single    Spouse Name: N/A    Number of Children: N/A  . Years of Education: N/A   Social History Main Topics  . Smoking  status: Never Smoker   . Smokeless tobacco: Never Used     Comment: Never smoked  . Alcohol Use: No  . Drug Use: No  . Sexual Activity: Not on file   Other Topics Concern  . Not on file   Social History Narrative    Review of Systems: Negative unless mentioned in HPI.   Physical Exam: BP 121/76 mmHg  Pulse 81  Temp(Src) 97.7 F (36.5 C)  Ht 5\' 4"  (1.626 m)  Wt 227 lb (102.967 kg)  BMI 38.95 kg/m2 General:   Alert and oriented. No distress noted. Pleasant and cooperative.  Head:  Normocephalic and atraumatic. Eyes:  Conjuctiva clear without scleral icterus. Mouth:  Oral mucosa pink and moist.  Abdomen:  +BS, soft, non-tender and non-distended. No rebound or guarding. Large AP diameter, difficult to appreciate HSM.  Msk:  Symmetrical without gross deformities. Normal posture. Extremities:  Mild trace lower extremity edema Neurologic:  Alert and  oriented x4;  grossly normal neurologically. Skin:  Intact without significant lesions or rashes. Psych:  Alert and cooperative. Normal mood and affect.  Lab Results  Component Value Date   WBC 3.4* 09/05/2014   HGB 14.4 09/05/2014   HCT 43.5 09/05/2014   MCV 100.9* 09/05/2014   PLT 68* 09/05/2014   Lab Results  Component Value Date   ALT 29 09/05/2014  AST 58* 09/05/2014   ALKPHOS 146* 09/05/2014   BILITOT 2.0* 09/05/2014

## 2014-10-04 NOTE — Progress Notes (Signed)
No pcp per patient 

## 2014-10-04 NOTE — Patient Instructions (Signed)
We have referred you to a nutritionist.   Start taking Prilosec one capsule each morning, 30 minutes before breakfast.   You may take Zofran, a nausea medication, every 8 hours as needed.   Please complete the blood work.   We have also ordered an ultrasound of your liver for routine screening. This is done every 6 months.  We will see you back in 3 months!

## 2014-10-08 ENCOUNTER — Ambulatory Visit (HOSPITAL_COMMUNITY): Payer: PRIVATE HEALTH INSURANCE | Admitting: Hematology & Oncology

## 2014-10-08 ENCOUNTER — Other Ambulatory Visit (HOSPITAL_COMMUNITY): Payer: PRIVATE HEALTH INSURANCE

## 2014-10-09 ENCOUNTER — Ambulatory Visit (HOSPITAL_COMMUNITY)
Admission: RE | Admit: 2014-10-09 | Discharge: 2014-10-09 | Disposition: A | Discharge status: Home / Self Care | Payer: PRIVATE HEALTH INSURANCE | Source: Ambulatory Visit | Attending: Gastroenterology | Admitting: Gastroenterology

## 2014-10-09 DIAGNOSIS — K7460 Unspecified cirrhosis of liver: Secondary | ICD-10-CM | POA: Diagnosis not present

## 2014-10-10 ENCOUNTER — Encounter: Payer: Self-pay | Admitting: Physician Assistant

## 2014-10-15 NOTE — Progress Notes (Signed)
ERroneous encounter

## 2014-10-20 ENCOUNTER — Encounter (HOSPITAL_COMMUNITY): Payer: Self-pay | Admitting: Emergency Medicine

## 2014-10-20 ENCOUNTER — Emergency Department (HOSPITAL_COMMUNITY)
Admission: EM | Admit: 2014-10-20 | Discharge: 2014-10-20 | Disposition: A | Discharge status: Home / Self Care | Payer: PRIVATE HEALTH INSURANCE | Attending: Emergency Medicine | Admitting: Emergency Medicine

## 2014-10-20 ENCOUNTER — Emergency Department (HOSPITAL_COMMUNITY): Payer: PRIVATE HEALTH INSURANCE

## 2014-10-20 DIAGNOSIS — R42 Dizziness and giddiness: Secondary | ICD-10-CM | POA: Diagnosis present

## 2014-10-20 DIAGNOSIS — Z8673 Personal history of transient ischemic attack (TIA), and cerebral infarction without residual deficits: Secondary | ICD-10-CM | POA: Diagnosis not present

## 2014-10-20 DIAGNOSIS — M1990 Unspecified osteoarthritis, unspecified site: Secondary | ICD-10-CM | POA: Insufficient documentation

## 2014-10-20 DIAGNOSIS — R4182 Altered mental status, unspecified: Secondary | ICD-10-CM | POA: Insufficient documentation

## 2014-10-20 DIAGNOSIS — Z79899 Other long term (current) drug therapy: Secondary | ICD-10-CM | POA: Diagnosis not present

## 2014-10-20 DIAGNOSIS — E7220 Disorder of urea cycle metabolism, unspecified: Secondary | ICD-10-CM | POA: Diagnosis not present

## 2014-10-20 DIAGNOSIS — Z791 Long term (current) use of non-steroidal anti-inflammatories (NSAID): Secondary | ICD-10-CM | POA: Diagnosis not present

## 2014-10-20 LAB — COMPREHENSIVE METABOLIC PANEL
ALT: 35 U/L (ref 0–35)
AST: 58 U/L — AB (ref 0–37)
Albumin: 2.9 g/dL — ABNORMAL LOW (ref 3.5–5.2)
Alkaline Phosphatase: 143 U/L — ABNORMAL HIGH (ref 39–117)
Anion gap: 4 — ABNORMAL LOW (ref 5–15)
BILIRUBIN TOTAL: 1.5 mg/dL — AB (ref 0.3–1.2)
BUN: 13 mg/dL (ref 6–23)
CHLORIDE: 116 mmol/L — AB (ref 96–112)
CO2: 22 mmol/L (ref 19–32)
Calcium: 9.9 mg/dL (ref 8.4–10.5)
Creatinine, Ser: 0.57 mg/dL (ref 0.50–1.10)
GFR calc Af Amer: >90 mL/min (ref >90)
Glucose, Bld: 97 mg/dL (ref 70–99)
POTASSIUM: 3.9 mmol/L (ref 3.5–5.1)
Sodium: 142 mmol/L (ref 135–145)
Total Protein: 6.8 g/dL (ref 6.0–8.3)

## 2014-10-20 LAB — CBC WITH DIFFERENTIAL/PLATELET
BASOS PCT: 1 % (ref 0–1)
Basophils Absolute: 0 10*3/uL (ref 0.0–0.1)
EOS ABS: 0.2 10*3/uL (ref 0.0–0.7)
Eosinophils Relative: 5 % (ref 0–5)
HEMATOCRIT: 44.6 % (ref 36.0–46.0)
Hemoglobin: 15 g/dL (ref 12.0–15.0)
LYMPHS ABS: 1.4 10*3/uL (ref 0.7–4.0)
Lymphocytes Relative: 29 % (ref 12–46)
MCH: 34.2 pg — ABNORMAL HIGH (ref 26.0–34.0)
MCHC: 33.6 g/dL (ref 30.0–36.0)
MCV: 101.6 fL — ABNORMAL HIGH (ref 78.0–100.0)
MONOS PCT: 8 % (ref 3–12)
Monocytes Absolute: 0.4 10*3/uL (ref 0.1–1.0)
NEUTROS PCT: 57 % (ref 43–77)
Neutro Abs: 2.7 10*3/uL (ref 1.7–7.7)
Platelets: 67 10*3/uL — ABNORMAL LOW (ref 150–400)
RBC: 4.39 MIL/uL (ref 3.87–5.11)
RDW: 14.9 % (ref 11.5–15.5)
WBC: 4.8 10*3/uL (ref 4.0–10.5)

## 2014-10-20 LAB — URINALYSIS, ROUTINE W REFLEX MICROSCOPIC
BILIRUBIN URINE: NEGATIVE
Glucose, UA: NEGATIVE mg/dL
KETONES UR: NEGATIVE mg/dL
Leukocytes, UA: NEGATIVE
NITRITE: NEGATIVE
PROTEIN: NEGATIVE mg/dL
SPECIFIC GRAVITY, URINE: 1.01 (ref 1.005–1.030)
UROBILINOGEN UA: 4 mg/dL — AB (ref 0.0–1.0)
pH: 7.5 (ref 5.0–8.0)

## 2014-10-20 LAB — URINE MICROSCOPIC-ADD ON

## 2014-10-20 LAB — PROTIME-INR
INR: 1.35 (ref 0.00–1.49)
Prothrombin Time: 16.8 seconds — ABNORMAL HIGH (ref 11.6–15.2)

## 2014-10-20 LAB — TROPONIN I: Troponin I: <0.03 ng/mL (ref <0.031)

## 2014-10-20 LAB — AMMONIA: Ammonia: 106 umol/L — ABNORMAL HIGH (ref 11–32)

## 2014-10-20 LAB — LIPASE, BLOOD: Lipase: 38 U/L (ref 11–59)

## 2014-10-20 MED ORDER — LACTULOSE 10 GM/15ML PO SOLN: 20.0000 g | Freq: Three times a day (TID) | ORAL | Status: DC

## 2014-10-20 MED ORDER — LACTULOSE 10 GM/15ML PO SOLN
30.0000 g | Freq: Once | ORAL | Status: AC
Start: 2014-10-20 — End: 2014-10-20
  Administered 2014-10-20: 30 g via ORAL
  Filled 2014-10-20: qty 60

## 2014-10-20 NOTE — ED Notes (Signed)
Per family member patient had "shakes" and dizziness 8 months ago, came to this ER 6 months ago and was diagnosed with kidney infection. Family reports symptoms went away with the medication and then returned again 2 months ago with today being the worst. Per family patient has had slurred speech and weakness that started with "shaking" and dizziness 2 months ago.

## 2014-10-20 NOTE — ED Notes (Signed)
Patient does not speak [place], family acting as interpreter.

## 2014-10-20 NOTE — Discharge Instructions (Signed)
Encefalopata heptica (Hepatic Encephalopathy) La encefalopata heptica es un sndrome. Se trata de un conjunto de sntomas que ocurren juntos. Se observa principalmente en pacientes con dao heptico conocido como cirrosis. Ocurre cuando el tejido normal del hgado es reemplazado por tejido Pensions consultant. Los sntomas del sndrome son:  Ambulance person en la personalidad  Trastornos mentales  Nivel de conciencia deprimido Estos cambios se producen debido a que las toxinas se acumulan en el torrente sanguneo. La acumulacin se produce debido a que el hgado con cicatrices no puede eliminar las toxinas del organismo. La ms importante de estas toxinas es el amonaco. Las toxinas pueden causar una conducta anormal y un estado de confusin. Las toxinas en la sangre pueden interferir en la capacidad de cuidarse a s mismo y cuidar de los dems. Algunas personas se tornan somnolientas y no pueden despertarse fcilmente. En los casos graves, el paciente entra en [place] de coma.  CAUSAS Hay varios factores que pueden ocasionar dao heptico y que pueden producir la acumulacin de toxinas. Ellos son:  Genia Del causan cirrosis en el hgado  El consumo de alcohol a largo plazo con dao heptico progresivo  Hepatitis B o C, con infeccin y dao heptico  Pacientes que no sufren cirrosis pero han sido sometidos a Marine scientist de derivacin  Insuficiencia renal  Hemorragia estomacal e intestinal  Infecciones  Constipacin  Medicamentos que actan sobre el sistema nervioso central  Tratamiento con diurticos  Excesivo consumo de protenas SNTOMAS Los sntomas de este sndrome se categorizan segn la gravedad.   [place] 0 Encefalopata heptica mnima. No hay cambios detectables en la personalidad o la conducta. Hay cambios mnimos en la memoria, la concentracin, las funciones mentales y la capacidad fsica.  [place] 1 Cierta falta de conciencia. Disminucin en el mantenimiento de la atencin.  Problemas para sumar o Technical brewer. Posibles problemas con el sueo o cambios en el patrn de sueo normal. Euforia, depresin o irritabilidad. Confusin leve. Enlentecimiento de la capacidad mental. Pueden detectarse temblores.  [place] 2 Letargo o apata. Desorientacin. Conducta extraa. Habla arrastrando las palabras. Temblores evidentes. Somnolencia, incapaz de Designer, jewellery. Cambios en la personalidad y confusin.  Et[place] 3. Muy somnoliento pero puede despertarse. Incapaz de Optometrist tareas mentales, no tiene registro de [place], confusin [place], amnesia, ataques de furia ocasionales, no se comprende el habla.  [place] 4 Coma con o sin respuesta al estmulo doloroso. DIAGNSTICO En los casos leves, una exhaustiva historia clnica y un adecuado examen clnico pueden ayudar al mdico a considerar la posibilidad de encefalopata leve como causa de los sntomas. El diagnstico es ms claro en los casos ms graves. Un nivel elevado de amonaco es la anormalidad caracterstica que se encuentra en los anlisis de sangre de los pacientes que sufren este sntoma. Otras pruebas pueden ser de ayuda para descartar otras enfermedades.  TRATAMIENTO  Se utilizan medicamentos para disminuir el nivel de [employer]. Esto generalmente produce una mejora.  Las dietas que contienen protenas vegetales son mejores que las dietas ricas en protenas animales, especialmente protenas derivadas de carnes rojas. El consumo de pollo y pescado bien cocidos, adems de las protenas vegetales deber ser analizado junto con su mdico. Los pacientes desnutridos son alentados a Soil scientist suplementos nutricionales lquidos a su dieta.  En algunos casos se utilizan antibiticos para tratar de disminuir el volumen de bacterias que producen amonaco en los intestinos.  En los casos moderados a leves, este sndrome generalmente requiere hospitalizacin y medicamentos inyectados directamente en la vena (  va  intravenosa). INSTRUCCIONES PARA EL CUIDADO DOMICILIARIO El objetivo es evitar las cosas que puedan empeorar el trastorno y Electronics engineer la acumulacin de amonaco en la [place].  Consuma una dieta normal y bien balanceada. El mdico podr darle algunas indicaciones.  Converse con su mdico antes de tomar suplementos vitamnicos. Las dosis grandes de vitaminas y minerales, especialmente la vitamina A, o el cobre, pueden empeorar el dao heptico.  Una dieta baja en sal, restriccin de agua o diurticos podrn ser necesarios para reducir la retencin de lquidos.  Evite el consumo de alcohol y Estate agent, as como tambin medicamentos de venta libre que contengan acetaminofeno (controle las etiquetas). Slo tome medicamentos de venta libre o prescriptos para Glass blower/designer, las [place], o bajar la fiebre segn las indicaciones de su mdico.  Evite los frmacos que son txicos para Engineer, maintenance (IT). Revise sus medicamentos (tanto los prescriptos como los de venta libre) con su mdico para estar seguro que los que toma no lo perjudican.  Puede ser necesaria la realizacin de anlisis de [place]. Siga el consejo del profesional que lo asiste con respecto al momento en que deber Tree surgeon.  En esta enfermedad usted tiene una funcin muy importante en el mantenimiento en buen estado de su salud. Si no sigue las indicaciones de su mdico y Soil scientist instrucciones, puede sufrir una discapacidad permanente y [employer]. SOLICITE ATENCIN MDICA SI:  Presenta fatiga o debilidad en aumento.  Observa que se le [employer], los pies, las piernas, el rostro o el abdomen.  Pierde el apetito.  Siente nuseas o vomita.  Desarrolla ictericia. Se denomina ictericia al trastorno que ocasiona tono amarillento en la piel.  Tiene problemas en la concentracin, confusin y o trastornos del sueo. SOLICITE ATENCIN MDICA DE INMEDIATO SI:  Tiene vmitos de sangre de color rojo brillante o semejante a la  borra del caf.  Usted presenta sangre en la orina. O las heces son negras y de Programmer, systems.  Tiene fiebre.  Se le forman moretones o tiene hemorragias con facilidad.  Vuelve a arrastrar las palabras, tiene cambios en su conducta o se siente confuso. ASEGRESE QUE:   Comprende estas instrucciones.  Controlar su enfermedad.  Pedir ayuda de inmediato si no mejora o empeora. Document Released: 12/31/2008 Document Revised: 12/07/2011 [facility] Patient Information 2015 [place]. This information is not intended to replace advice given to you by your health care provider. Make sure you discuss any questions you have with your health care provider. Cirrosis (Cirrhosis) Le han diagnosticado que padece cirrosis. Esta enfermedad consiste en un proceso de cicatrizacin del hgado, que se origina cuando este rgano trata de repararse despus sufrir un dao. El dao puede provenir de una infeccin previa, como la que se produce luego de una de las formas de la hepatitis (generalmente hepatitis C) o tambin por la accin de algunas toxinas. La principal toxina es el alcohol. La cicatrizacin del hgado que causa el alcohol es irreversible. Significa que el hgado no puede volver a su estado normal aunque no se consuma ms alcohol. El dao principal que causa la infeccin por hepatitis C es la enfermedad crnica (de larga duracin) del hgado, y tambin puede conducir a la cirrosis. Esta complicacin es progresiva e irreversible. CAUSAS Antes de disponer de las pruebas de [place], la hepatitis B poda contraerse por transfusiones de [place]. Ahora es bastante improbable, ya que las pruebas han mejorado. Esta infeccin tambin puede adquirirse por el uso de drogas por va intravenosa y por  compartir las agujas. Tambin puede contagiarse a travs de las [employer]. La lesin que produce el alcohol se debe al [employer]. El hecho de beber algunos tragos no enfermar al hgado,  sino que el riesgo aparece cuando el consumo es excesivo. Generalmente habr algunos signos y sntomas a comienzos del proceso de cicatrizacin del hgado que tendran que alertar para desarrollar mejores hbitos. El alcohol nunca debera consumirse si est tomando acetaminofeno. Pequeas dosis de ambos, consumidos en forma conjunta, pueden causar lesiones irreversibles en el hgado. INSTRUCCIONES PARA EL CUIDADO DOMICILIARIO No existe un tratamiento especfico para la cirrosis; sin embargo hay algunas cosas que usted puede hacer para evitar que la enfermedad empeore.  Descanse todo lo que pueda.  Consuma una dieta normal y bien balanceada. El profesional que lo asiste podr darle algunas indicaciones.  Suplemento vitamnicos que incluyan vitaminas A, K, D y tiamina pueden ayudar.  Una dieta baja en sal, restriccin de agua o diurticos podrn ser necesarios para reducir la retencin de lquidos.  Evite el alcohol. Puede ser extremadamente txico si lo combina con acetaminofeno.  Evite los frmacos que son txicos para el hgado. Entre ellas se incluyen: Isoniacida, metildopa acetaminofeno, esteroides anablicos (frmacos que energizan los msculos), eritromicina, y anticonceptivos orales (pldoras para el control de la natalidad). Realice un control con el profesional que lo asiste para comprobar que los medicamentos que est tomando no sern nocivos.  Podr requerir que se le practiquen pruebas de sangre peridicamente. Siga el consejo del profesional que lo asiste con respecto al momento en que deber Tree surgeon.  La leche de cardo es un remedio derivado de hierbas que protege al hgado de las toxinas; sin embargo, no ser de [place]. SOLICITE ATENCIN MDICA SI:   Presenta fatiga o debilidad en aumento.  Observa que se le [employer], los pies, las piernas o el abdomen.  Tiene vmitos de sangre de color rojo brillante o semejante a la borra del caf.  Observa sangre en la  materia fecal, o las heces se tornan negras y de aspecto alquitranado.  Tiene fiebre.  Ha perdido el apetito, siente nuseas o vmitos.  Desarrolla ictericia.  Se le forman moretones o tiene hemorragias con facilidad.  Alguno de los [employer] lo preocupaban, comienzan a Copy. Document Released: 09/14/2005 Document Revised: 12/07/2011 [facility] Patient Information 2015 [place]. This information is not intended to replace advice given to you by your health care provider. Make sure you discuss any questions you have with your health care provider.

## 2014-10-20 NOTE — ED Provider Notes (Signed)
CSN: [id number]     Arrival date & time 10/20/14  1538 History   First MD Initiated Contact with Patient 10/20/14 1549     Chief Complaint  Patient presents with  . Dizziness  . Tremors     (Consider location/radiation/quality/duration/timing/severity/associated sxs/prior Treatment) HPI Comments: Patient brought to the ER for evaluation of tremors, shakes, chills, mental status change. Family reports that a similar presentation approximately 6 months revealed that she had a kidney infection. She has once again started to have similar symptoms. Family reports that she has episodes where she becomes incoherent, shakes all over and does not respond well. They report that when these episodes occur, they can understand her speech and these usually last about 30 minutes.  Patient is a 59 y.o. female presenting with dizziness. The history is limited by a language barrier. Language interpreter used: family.  Dizziness   Past Medical History  Diagnosis Date  . Arthritis   . Stroke     slight  . 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 Appendectomy    . External ear surgery    . Abdominal hysterectomy    . Colonoscopy N/A 07/04/2014    Dr. Vivi Ferns  . Esophagogastroduodenoscopy N/A 07/04/2014    Dr. Rourk:normal esophagus, query portal gastropathy, chronic atrophic gastritis with intestinal metaplasia, screening EGD in 2-3 years   Family History  Problem Relation Age of Onset  . Liver cancer Mother   . Colon cancer Neg Hx    History  Substance Use Topics  . Smoking status: Never Smoker   . Smokeless tobacco: Never Used     Comment: Never smoked  . Alcohol Use: No   OB History    Gravida Para Term Preterm AB TAB SAB Ectopic Multiple Living   5 5 5       5      Review of Systems  Constitutional: Positive for chills.  Neurological: Positive for dizziness, tremors and speech difficulty.  Psychiatric/Behavioral: Positive for confusion.   All other systems reviewed and are negative.     Allergies  Shrimp  Home Medications   Prior to Admission medications   Medication Sig Start Date End Date Taking? Authorizing Provider  meloxicam (MOBIC) 15 MG tablet Take 15 mg by mouth daily.   Yes Historical Provider, MD  omeprazole (PRILOSEC) 20 MG capsule Take 1 capsule (20 mg total) by mouth daily. 30 minutes before breakfast 10/04/14  Yes Orvil Feil, NP  ondansetron (ZOFRAN) 4 MG tablet Take 1 tablet (4 mg total) by mouth every 8 (eight) hours as needed for nausea or vomiting. 10/04/14  Yes Orvil Feil, NP  traMADol (ULTRAM) 50 MG tablet Take 50 mg by mouth every 6 (six) hours as needed for moderate pain or severe pain.    Yes Historical Provider, MD  lactulose (CHRONULAC) 10 GM/15ML solution Take 30 mLs (20 g total) by mouth 3 (three) times daily. 10/20/14   Orpah Greek, MD   BP 131/74 mmHg  Pulse 73  Temp(Src) 99 F (37.2 C) (Oral)  Resp 13  SpO2 98% Physical Exam  Constitutional: She is oriented to person, place, and time. She appears well-developed and well-nourished. No distress.  HENT:  Head: Normocephalic and atraumatic.  Right Ear: Hearing normal.  Left Ear: Hearing normal.  Nose: Nose normal.  Mouth/Throat: Oropharynx is clear and moist and mucous membranes are normal.  Eyes: Conjunctivae and EOM are normal. Pupils are equal,  round, and reactive to light.  Neck: Normal range of motion. Neck supple.  Cardiovascular: Regular rhythm, S1 normal and S2 normal.  Exam reveals no gallop and no friction rub.   No murmur heard. Pulmonary/Chest: Effort normal and breath sounds normal. No respiratory distress. She exhibits no tenderness.  Abdominal: Soft. Normal appearance and bowel sounds are normal. There is no hepatosplenomegaly. There is no tenderness. There is no rebound, no guarding, no tenderness at McBurney's point and negative Murphy's sign. No hernia.  Musculoskeletal: Normal range of motion.  Neurological:  She is alert and oriented to person, place, and time. She has normal strength. No cranial nerve deficit or sensory deficit. Coordination normal. GCS eye subscore is 4. GCS verbal subscore is 5. GCS motor subscore is 6.  Skin: Skin is warm, dry and intact. No rash noted. No cyanosis.  Psychiatric: She has a normal mood and affect. Her speech is normal and behavior is normal. Thought content normal.  Nursing note and vitals reviewed.   ED Course  Procedures (including critical care time) Labs Review Labs Reviewed  CBC WITH DIFFERENTIAL/PLATELET - Abnormal; Notable for the following:    MCV 101.6 (*)    MCH 34.2 (*)    Platelets 67 (*)    All other components within normal limits  COMPREHENSIVE METABOLIC PANEL - Abnormal; Notable for the following:    Chloride 116 (*)    Albumin 2.9 (*)    AST 58 (*)    Alkaline Phosphatase 143 (*)    Total Bilirubin 1.5 (*)    Anion gap 4 (*)    All other components within normal limits  URINALYSIS, ROUTINE W REFLEX MICROSCOPIC - Abnormal; Notable for the following:    Hgb urine dipstick TRACE (*)    Urobilinogen, UA 4.0 (*)    All other components within normal limits  AMMONIA - Abnormal; Notable for the following:    Ammonia 106 (*)    All other components within normal limits  PROTIME-INR - Abnormal; Notable for the following:    Prothrombin Time 16.8 (*)    All other components within normal limits  URINE MICROSCOPIC-ADD ON - Abnormal; Notable for the following:    Squamous Epithelial / LPF FEW (*)    Bacteria, UA FEW (*)    All other components within normal limits  URINE CULTURE  LIPASE, BLOOD  TROPONIN I  I-STAT CG4 LACTIC ACID, ED    Imaging Review Dg Chest 1 View  10/20/2014   CLINICAL DATA:  Recurrent shakes and dizziness over the last 8 months. Slurred speech and weakness for 2 months.  EXAM: CHEST - 1 VIEW  COMPARISON:  Chest CT 04/09/2014.  Radiographs 04/08/2014.  FINDINGS: 1608 hr. The heart size and mediastinal contours are  normal. The lungs are clear. There is no pleural effusion or pneumothorax. No acute osseous findings are identified. Telemetry leads overlie the chest.  IMPRESSION: No active cardiopulmonary process.   Electronically Signed   By: Camie Patience M.D.   On: 10/20/2014 16:17   Ct Head Wo Contrast  10/20/2014   CLINICAL DATA:  Weakness for 3 days, shaking  EXAM: CT HEAD WITHOUT CONTRAST  TECHNIQUE: Contiguous axial images were obtained from the base of the skull through the vertex without intravenous contrast.  COMPARISON:  None.  FINDINGS: No skull fracture is noted. Paranasal sinuses are unremarkable. Postsurgical changes are noted left mastoid region.  No intracranial hemorrhage, mass effect or midline shift.  No acute cortical infarction. The gray and white-matter  differentiation is preserved. No hydrocephalus. No intra or extra-axial fluid collection. No mass lesion is noted on this unenhanced scan.  IMPRESSION: No acute intracranial abnormality. No definite acute cortical infarction.   Electronically Signed   By: Lahoma Crocker M.D.   On: 10/20/2014 16:57     EKG Interpretation   Date/Time:  Saturday October 20 2014 15:57:11 EST Ventricular Rate:  87 PR Interval:  149 QRS Duration: 94 QT Interval:  373 QTC Calculation: 449 R Axis:   -29 Text Interpretation:  Sinus rhythm Borderline left axis deviation Abnormal  R-wave progression, late transition Baseline wander in lead(s) II III aVF  V1 V4 V5 No previous tracing Confirmed by Keivon Garden  MD, Lou Loewe [phone])  on 10/20/2014 4:01:33 PM      MDM   Final diagnoses:  Change in mental status  Hyperammonemia    As to the ER for evaluation of tremors and mental status changes. Patient does have a history of liver cirrhosis. She is not currently under the care of a hepatologist. Family reports progression of symptoms over approximately 2 months. They're concerned because she has had previous symptoms with a kidney infection. She was hospitalized  earlier in the year for sepsis secondary to urinary tract infection. There is, however, no sign of infection at this time. She does have markedly elevated ammonia which explain this symptoms. She is awake and alert, however, can be treated as an outpatient. Initiated on lactulose and will be referred to gastroenterology and primary care.  Orpah Greek, MD 10/21/14 1524

## 2014-10-20 NOTE — ED Notes (Signed)
Patient with no complaints at this time. Respirations even and unlabored. Skin warm/dry. Discharge instructions reviewed with patient at this time. Patient given opportunity to voice concerns/ask questions. Patient discharged at this time and left Emergency Department with steady gait.   

## 2014-10-22 LAB — URINE CULTURE

## 2014-10-22 NOTE — Progress Notes (Signed)
Quick Note:  Negative [place] for [place].  Will repeat in 6 months. ______

## 2014-10-23 ENCOUNTER — Encounter (HOSPITAL_COMMUNITY): Payer: PRIVATE HEALTH INSURANCE | Attending: Hematology and Oncology | Admitting: Hematology & Oncology

## 2014-10-23 ENCOUNTER — Other Ambulatory Visit (HOSPITAL_COMMUNITY): Payer: Self-pay | Admitting: Hematology & Oncology

## 2014-10-23 ENCOUNTER — Encounter (HOSPITAL_COMMUNITY): Payer: PRIVATE HEALTH INSURANCE

## 2014-10-23 DIAGNOSIS — K7460 Unspecified cirrhosis of liver: Secondary | ICD-10-CM | POA: Diagnosis not present

## 2014-10-23 DIAGNOSIS — M1990 Unspecified osteoarthritis, unspecified site: Secondary | ICD-10-CM | POA: Diagnosis not present

## 2014-10-23 DIAGNOSIS — K7581 Nonalcoholic steatohepatitis (NASH): Secondary | ICD-10-CM | POA: Insufficient documentation

## 2014-10-23 DIAGNOSIS — D242 Benign neoplasm of left breast: Secondary | ICD-10-CM | POA: Diagnosis not present

## 2014-10-23 DIAGNOSIS — D696 Thrombocytopenia, unspecified: Secondary | ICD-10-CM | POA: Diagnosis not present

## 2014-10-23 DIAGNOSIS — R928 Other abnormal and inconclusive findings on diagnostic imaging of breast: Secondary | ICD-10-CM

## 2014-10-23 DIAGNOSIS — R161 Splenomegaly, not elsewhere classified: Secondary | ICD-10-CM | POA: Diagnosis not present

## 2014-10-23 DIAGNOSIS — K7469 Other cirrhosis of liver: Secondary | ICD-10-CM

## 2014-10-23 DIAGNOSIS — Z09 Encounter for follow-up examination after completed treatment for conditions other than malignant neoplasm: Secondary | ICD-10-CM

## 2014-10-23 DIAGNOSIS — N6310 Unspecified lump in the right breast, unspecified quadrant: Secondary | ICD-10-CM

## 2014-10-23 LAB — CBC WITH DIFFERENTIAL/PLATELET
Basophils Absolute: 0 10*3/uL (ref 0.0–0.1)
Basophils Relative: 1 % (ref 0–1)
EOS ABS: 0.3 10*3/uL (ref 0.0–0.7)
Eosinophils Relative: 7 % — ABNORMAL HIGH (ref 0–5)
HCT: 44.7 % (ref 36.0–46.0)
Hemoglobin: 15 g/dL (ref 12.0–15.0)
LYMPHS ABS: 1.8 10*3/uL (ref 0.7–4.0)
LYMPHS PCT: 40 % (ref 12–46)
MCH: 34 pg (ref 26.0–34.0)
MCHC: 33.6 g/dL (ref 30.0–36.0)
MCV: 101.4 fL — ABNORMAL HIGH (ref 78.0–100.0)
Monocytes Absolute: 0.4 10*3/uL (ref 0.1–1.0)
Monocytes Relative: 8 % (ref 3–12)
Neutro Abs: 2 10*3/uL (ref 1.7–7.7)
Neutrophils Relative %: 44 % (ref 43–77)
Platelets: 61 10*3/uL — ABNORMAL LOW (ref 150–400)
RBC: 4.41 MIL/uL (ref 3.87–5.11)
RDW: 14.7 % (ref 11.5–15.5)
WBC: 4.6 10*3/uL (ref 4.0–10.5)

## 2014-10-23 LAB — I-STAT CG4 LACTIC ACID, ED: Lactic Acid, Venous: 1.62 mmol/L (ref 0.5–2.0)

## 2014-10-23 NOTE — Patient Instructions (Signed)
[place] at [facility]  Discharge Instructions:  Please call with any problems such as new bruising or bleeding from your gums or nose We will see you back in 6 months with labs We will help schedule you with a PCP _______________________________________________________________  Thank you for choosing [place] at [facility] to provide your oncology and hematology care.  To afford each patient quality time with our providers, please arrive at least 15 minutes before your scheduled appointment.  You need to re-schedule your appointment if you arrive 10 or more minutes late.  We strive to give you quality time with our providers, and arriving late affects you and other patients whose appointments are after yours.  Also, if you no show three or more times for appointments you may be dismissed from the clinic.  Again, thank you for choosing [place] at [facility] hope is that these requests will allow you access to exceptional care and in a timely manner. _______________________________________________________________  If you have questions after your visit, please contact our office at [phone] between the hours of 8:30 a.m. and 5:00 p.m. Voicemails left after 4:30 p.m. will not be returned until the following business day. _______________________________________________________________  For prescription refill requests, have your pharmacy contact our office. _______________________________________________________________  Recommendations made by the consultant and any test results will be sent to your referring physician. _______________________________________________________________

## 2014-10-23 NOTE — Progress Notes (Signed)
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 Sydell Axon  CURRENT THERAPY: Observation  INTERVAL HISTORY: Dawn Beard 59 y.o. female returns for follow-up of her thrombocytopenia felt to be secondary to cirrhosis/splenomegaly. She denies any bleeding or bruising.  Her appetite is good.   She complains of a "wooshing sound in her ears"  She was seen in the ED over the weekend. She was told her ammonia level was high and given lactulose.  She does not have a PCP.  MEDICAL HISTORY: Past Medical History  Diagnosis Date  . Arthritis   . Stroke     slight    MEDICAL HISTORY: Past Medical History  Diagnosis Date  . Arthritis   . Stroke     slight  . 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     is allergic to shrimp.  Ms. Yagi does not currently have medications on file.  SURGICAL HISTORY: Past Surgical History  Procedure Laterality Date  . Appendectomy    . External ear surgery    . Abdominal hysterectomy    . Colonoscopy N/A 07/04/2014    Dr. Vivi Ferns  . Esophagogastroduodenoscopy N/A 07/04/2014    Dr. Rourk:normal esophagus, query portal gastropathy, chronic atrophic gastritis with intestinal metaplasia, screening EGD in 2-3 years    SOCIAL HISTORY: History   Social History  . Marital Status: Married    Spouse Name: N/A    Number of Children: N/A  . Years of Education: N/A   Occupational History  . Not on file.   Social History Main Topics  . Smoking status: Never Smoker   . Smokeless tobacco: Never Used     Comment: Never smoked  . Alcohol Use: No  . Drug Use: No  . Sexual Activity: Not on file   Other Topics  Concern  . Not on file   Social History Narrative    FAMILY HISTORY: Family History  Problem Relation Age of Onset  . Liver cancer Mother   . Colon cancer Neg Hx     Review of Systems  Constitutional: Negative.   HENT: Positive for tinnitus.   Eyes: Negative.   Respiratory: Negative.   Cardiovascular: Negative.   Gastrointestinal: Negative.   Genitourinary: Negative.   Musculoskeletal: Negative.   Skin: Negative.   Neurological: Negative.        Just presented to ED with dizziness, tremor  Endo/Heme/Allergies: Negative.   Psychiatric/Behavioral: Negative.     PHYSICAL EXAMINATION  ECOG PERFORMANCE STATUS: 0 - Asymptomatic  There were no vitals filed for this visit.  Physical Exam  Constitutional: She is oriented to person, place, and time and well-developed, well-nourished, and in no distress.  obese  HENT:  Head: Normocephalic and atraumatic.  Nose: Nose normal.  Mouth/Throat: Oropharynx is clear and moist. No oropharyngeal exudate.  Eyes: Conjunctivae and EOM are normal. Pupils are equal, round, and reactive to light. Right eye exhibits no discharge. Left eye exhibits no discharge. No scleral icterus.  Neck: Normal range of motion. Neck supple. No tracheal deviation present. No thyromegaly present.  Cardiovascular: Normal rate, regular rhythm and normal heart sounds.  Exam reveals no gallop and no friction rub.   No murmur heard. Pulmonary/Chest: Effort normal and breath sounds normal.  She has no wheezes. She has no rales.  Abdominal: Soft. Bowel sounds are normal. She exhibits no distension and no mass. There is no tenderness. There is no rebound and no guarding.  Musculoskeletal: Normal range of motion. She exhibits no edema.  Lymphadenopathy:    She has no cervical adenopathy.  Neurological: She is alert and oriented to person, place, and time. She has normal reflexes. No cranial nerve deficit. Gait normal. Coordination normal.  Skin: Skin is warm and dry. No  rash noted.  Psychiatric: Mood, memory, affect and judgment normal.  Nursing note and vitals reviewed.   LABORATORY DATA:  CBC    Component Value Date/Time   WBC 4.6 10/23/2014 1007   RBC 4.41 10/23/2014 1007   HGB 15.0 10/23/2014 1007   HGB 14.7 04/30/2014   HCT 44.7 10/23/2014 1007   PLT 61* 10/23/2014 1007   MCV 101.4* 10/23/2014 1007   MCH 34.0 10/23/2014 1007   MCHC 33.6 10/23/2014 1007   RDW 14.7 10/23/2014 1007   LYMPHSABS 1.8 10/23/2014 1007   MONOABS 0.4 10/23/2014 1007   EOSABS 0.3 10/23/2014 1007   BASOSABS 0.0 10/23/2014 1007   CMP     Component Value Date/Time   NA 142 10/20/2014 1647   K 3.9 10/20/2014 1647   CL 116* 10/20/2014 1647   CO2 22 10/20/2014 1647   GLUCOSE 97 10/20/2014 1647   BUN 13 10/20/2014 1647   CREATININE 0.57 10/20/2014 1647   CALCIUM 9.9 10/20/2014 1647   PROT 6.8 10/20/2014 1647   ALBUMIN 2.9* 10/20/2014 1647   AST 58* 10/20/2014 1647   AST 49 04/30/2014   ALT 35 10/20/2014 1647   ALKPHOS 143* 10/20/2014 1647   ALKPHOS 132 04/30/2014   BILITOT 1.5* 10/20/2014 1647   BILITOT 1.2 04/30/2014   GFRNONAA >90 10/20/2014 1647   GFRAA >90 10/20/2014 1647     ASSESSMENT and THERAPY PLAN:    Thrombocytopenia 59 year old female with a history of cirrhosis secondary to [place] and resultant splenomegaly. I discussed with the patient through the translator that the cause of her low platelet count is most likely her liver disease and splenomegaly. Currently her counts are stable and I have recommended ongoing observation.  I am going to try to get her back to GI sooner given her recent visit to the emergency department. In addition I advised she and her family that she needs a primary care physician given her complex medical issues. We will plan on seeing her back again in 6 months with labs. I advised her should she develop any new bruising or change in bruising, gum or nose bleeding, that she is to call for a sooner appointment or present to  the ER.     All questions were answered. The patient knows to call the clinic with any problems, questions or concerns. We can certainly see the patient much sooner if necessary. She does not have a PCP   [employer] 10/25/2014

## 2014-10-23 NOTE — Progress Notes (Deleted)
ERroneous encounter

## 2014-10-24 ENCOUNTER — Ambulatory Visit (INDEPENDENT_AMBULATORY_CARE_PROVIDER_SITE_OTHER): Payer: PRIVATE HEALTH INSURANCE | Admitting: Gastroenterology

## 2014-10-24 ENCOUNTER — Encounter: Payer: Self-pay | Admitting: Gastroenterology

## 2014-10-24 ENCOUNTER — Telehealth (HOSPITAL_BASED_OUTPATIENT_CLINIC_OR_DEPARTMENT_OTHER): Payer: Self-pay | Admitting: Emergency Medicine

## 2014-10-24 VITALS — BP 127/75 | HR 74 | Temp 97.5°F | Ht 62.0 in | Wt 220.2 lb

## 2014-10-24 DIAGNOSIS — K7469 Other cirrhosis of liver: Secondary | ICD-10-CM

## 2014-10-24 DIAGNOSIS — K7290 Hepatic failure, unspecified without coma: Secondary | ICD-10-CM | POA: Insufficient documentation

## 2014-10-24 DIAGNOSIS — K7682 Hepatic encephalopathy: Secondary | ICD-10-CM

## 2014-10-24 MED ORDER — RIFAXIMIN 550 MG PO TABS: 550.0000 mg | ORAL_TABLET | Freq: Two times a day (BID) | ORAL | Status: DC

## 2014-10-24 MED ORDER — LACTULOSE 10 GM/15ML PO SOLN: ORAL | Status: DC

## 2014-10-24 NOTE — Telephone Encounter (Signed)
Post ED Visit - Positive Culture Follow-up  Culture report reviewed by antimicrobial stewardship pharmacist: []  Wes [place], Pharm.D., BCPS [x]  Heide Guile, Pharm.D., BCPS []  Alycia Rossetti, Pharm.D., BCPS []  [place], Pharm.D., BCPS, AAHIVP []  Legrand [place], Pharm.D., BCPS, AAHIVP []  Isac Sarna, Pharm.D., BCPS  Positive urine culture E. Coli Treated with none,asymptomatic and no further patient follow-up is required at this time.  Hazle Nordmann 10/24/2014, 9:45 AM

## 2014-10-24 NOTE — Progress Notes (Signed)
cc'ed to pcp °

## 2014-10-24 NOTE — Assessment & Plan Note (Signed)
Up-to-date on [place] screening, labs, and EGD. Return in 4-6 weeks due to recent HE episode.

## 2014-10-24 NOTE — Patient Instructions (Signed)
Take 30 mL to 45 mL of lactulose 3 to 4 times a day to have 2-3 soft bowel movements daily.   I will try to have Xifaxan approved to take twice a day as well.   We will see you back in 4-6 weeks.   Please call with any confusion, mental status changes.

## 2014-10-24 NOTE — Assessment & Plan Note (Signed)
No precipitating factors identified. Overall, much improved with addition of lactulose. Need to titrate to achieve 2-3 soft bowel movements daily. Limit tramadol use. Return in 4-6 weeks. Add Xifaxan.

## 2014-10-24 NOTE — Progress Notes (Signed)
Referring Provider: No ref. provider found Primary Care Physician:  No PCP Per Patient  Primary GI: Dr. Gala Romney   Chief Complaint  Patient presents with  . Elevated ammonia level    HPI:   Dawn Beard is a 59 y.o. female presenting today with a history of likely NASH cirrhosis. Recent EGD on file without varices; needs repeat screening in 2-3 years. Colonoscopy up-to-date and normal. . Last seen earlier this month. Presented to the ED with mental status changes on 1/23. Ammonia level at that time 106. Prescribed lactulose. States she presented to the ED with confusion, hands trembling.   Today feels a little dizzy. Tramadol for leg pain as needed. Taking every 3rd day. No OTC agents or herbal medications. Denies any signs/symptoms of infection. Overall feels better.   Past Medical History  Diagnosis Date  . Arthritis   . Stroke     slight  . 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 Appendectomy    . External ear surgery    . Abdominal hysterectomy    . Colonoscopy N/A 07/04/2014    Dr. Vivi Ferns  . Esophagogastroduodenoscopy N/A 07/04/2014    Dr. Rourk:normal esophagus, query portal gastropathy, chronic atrophic gastritis with intestinal metaplasia, screening EGD in 2-3 years    Current Outpatient Prescriptions  Medication Sig Dispense Refill  . lactulose (CHRONULAC) 10 GM/15ML solution Take 30 mLs (20 g total) by mouth 3 (three) times daily. 946 mL 0  . meloxicam (MOBIC) 15 MG tablet Take 15 mg by mouth daily.    Marland Kitchen omeprazole (PRILOSEC) 20 MG capsule Take 1 capsule (20 mg total) by mouth daily. 30 minutes before breakfast 30 capsule 3  . ondansetron (ZOFRAN) 4 MG tablet Take 1 tablet (4 mg total) by mouth every 8 (eight) hours as needed for nausea or vomiting. 30 tablet 1  . traMADol (ULTRAM) 50 MG tablet Take 50 mg by mouth every 6 (six) hours as needed for moderate pain or severe pain.      No current  facility-administered medications for this visit.    Allergies as of 10/24/2014 - Review Complete 10/24/2014  Allergen Reaction Noted  . Shrimp [shellfish allergy] Itching and Rash 04/08/2014    Family History  Problem Relation Age of Onset  . Liver cancer Mother   . Colon cancer Neg Hx     History   Social History  . Marital Status: Married    Spouse Name: N/A    Number of Children: N/A  . Years of Education: N/A   Social History Main Topics  . Smoking status: Never Smoker   . Smokeless tobacco: Never Used     Comment: Never smoked  . Alcohol Use: No  . Drug Use: No  . Sexual Activity: None   Other Topics Concern  . None   Social History Narrative    Review of Systems: As mentioned in HPI  Physical Exam: BP 127/75 mmHg  Pulse 74  Temp(Src) 97.5 F (36.4 C) (Oral)  Ht 5\' 2"  (1.575 m)  Wt 220 lb 3.2 oz (99.882 kg)  BMI 40.26 kg/m2 General:   Alert and oriented. No distress noted. Pleasant and cooperative.  Head:  Normocephalic and atraumatic. Abdomen:  +BS, soft, non-tender and non-distended. large AP diameter. Difficult to appreciate HSM due to obesity.  Msk:  Symmetrical without gross deformities. Normal posture. Neurologic:  Alert and  oriented x4; negative asterixis.  Skin:  Intact without significant lesions or rashes. Psych:  Alert and cooperative. Normal mood and affect.  Lab Results  Component Value Date   ALT 35 10/20/2014   AST 58* 10/20/2014   ALKPHOS 143* 10/20/2014   BILITOT 1.5* 10/20/2014   Lab Results  Component Value Date   WBC 4.6 10/23/2014   HGB 15.0 10/23/2014   HCT 44.7 10/23/2014   MCV 101.4* 10/23/2014   PLT 61* 10/23/2014   Lab Results  Component Value Date   CREATININE 0.57 10/20/2014   BUN 13 10/20/2014   NA 142 10/20/2014   K 3.9 10/20/2014   CL 116* 10/20/2014   CO2 22 10/20/2014

## 2014-10-25 ENCOUNTER — Encounter (HOSPITAL_COMMUNITY): Payer: Self-pay | Admitting: Hematology & Oncology

## 2014-10-25 NOTE — Assessment & Plan Note (Signed)
59 year old female with a history of cirrhosis secondary to [place] and resultant splenomegaly. I discussed with the patient through the translator that the cause of her low platelet count is most likely her liver disease and splenomegaly. Currently her counts are stable and I have recommended ongoing observation.  I am going to try to get her back to GI sooner given her recent visit to the emergency department. In addition I advised she and her family that she needs a primary care physician given her complex medical issues. We will plan on seeing her back again in 6 months with labs. I advised her should she develop any new bruising or change in bruising, gum or nose bleeding, that she is to call for a sooner appointment or present to the ER.

## 2014-11-19 ENCOUNTER — Encounter: Payer: Self-pay | Admitting: Nutrition

## 2014-11-19 ENCOUNTER — Encounter: Payer: PRIVATE HEALTH INSURANCE | Attending: Gastroenterology | Admitting: Nutrition

## 2014-11-19 VITALS — Ht 64.0 in | Wt 228.0 lb

## 2014-11-19 DIAGNOSIS — K7581 Nonalcoholic steatohepatitis (NASH): Secondary | ICD-10-CM | POA: Diagnosis not present

## 2014-11-19 DIAGNOSIS — Z6839 Body mass index (BMI) 39.0-39.9, adult: Secondary | ICD-10-CM | POA: Insufficient documentation

## 2014-11-19 DIAGNOSIS — Z713 Dietary counseling and surveillance: Secondary | ICD-10-CM | POA: Insufficient documentation

## 2014-11-19 DIAGNOSIS — K7460 Unspecified cirrhosis of liver: Secondary | ICD-10-CM | POA: Diagnosis not present

## 2014-11-19 DIAGNOSIS — E669 Obesity, unspecified: Secondary | ICD-10-CM | POA: Diagnosis not present

## 2014-11-19 NOTE — Progress Notes (Signed)
  Medical Nutrition Therapy:  Appt start time: 1330 end time:  1430.  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 Lives with her husband and son. Her son is with her today as Optometrist. Translator didn't come to appointment. PT's son wanted to keep appointment and refused a Optometrist.  This is the most weight she has weighed. No diabetes recorded.. No high blood pressure.   Diet is poor in all food groups. Doesn't eat balanced meals. Skips a lot of meals. Drinks sodas and was eating some ice cream and junk food. Says she doesn't get hungry very much. Doesn't usually eat breakfast. Was drinking a lot of juice.   Her daughter and family do the cooking and shopping. Uses a cane to walk. H/O of TIA.   Preferred Learning Style:   No preference indicated   Learning Readiness:    Ready  Change in progress  MEDICATIONS: See list   DIETARY INTAKE:  24-hr recall:  B ( AM): Skips Snk ( AM): nothing sometimes fruit  L ( PM): eggs, chicken or beef soup,Juice, Snk ( PM): no D ( PM): skips Snk ( PM): ice cream, chips, Beverages: juice, soda, mt dew  Usual physical activity: none.  Estimated energy needs: 1500 calories 170 g carbohydrates 112 g protein 42 g fat  Progress Towards Goal(s):  In progress.   Nutritional Diagnosis:  NB-1.1 Food and nutrition-related knowledge deficit As related to Fatty LIver/NASH.  As evidenced by Obesity with BMI 39 and poor diet..    Intervention:  Nutrition counseling on a low fat, high fiber low sodium diet. Reviewed meal planning, portion sizes and importance of balanced meals using the Plate Method at scheduled times during the day.  Goal  Eat three balanced meals per day according to the Plate Method. Avoid snacks Increase water intake to 5 bottles per day. Increase fresh fruits and vegetables. Exercise 15-30 minutes a day Lose 1 lb per week til next visit. Cut out fatty foods, processed foods,sodas and junk food.  Teaching  Method Utilized:  Visual Auditory Hands on  Handouts given during visit include:  The Plate Method  Meal Plan Card  Low Fat High Fiber Low Sodium Diet  Barriers to learning/adherence to lifestyle change: Walks with a cane: TIA  Demonstrated degree of understanding via:  Teach Back   Monitoring/Evaluation:  Dietary intake, exercise, meal planning, food journal, and body weight in 1 month(s).

## 2014-11-19 NOTE — Patient Instructions (Signed)
  Goal  Eat three balanced meals per day according to the Plate Method. Avoid snacks Increase water intake to 5 bottles per day. Increase fresh fruits and vegetables. Exercise 15-30 minutes a day Lose 1 lb per week til next visit. Cut out fatty foods, processed foods,sodas and junk food.

## 2014-11-24 ENCOUNTER — Observation Stay (HOSPITAL_COMMUNITY)
Admission: EM | Admit: 2014-11-24 | Discharge: 2014-11-25 | Disposition: A | Discharge status: Home / Self Care | Payer: PRIVATE HEALTH INSURANCE | Attending: Internal Medicine | Admitting: Internal Medicine

## 2014-11-24 ENCOUNTER — Encounter (HOSPITAL_COMMUNITY): Payer: Self-pay | Admitting: Emergency Medicine

## 2014-11-24 DIAGNOSIS — Z8673 Personal history of transient ischemic attack (TIA), and cerebral infarction without residual deficits: Secondary | ICD-10-CM | POA: Insufficient documentation

## 2014-11-24 DIAGNOSIS — M5442 Lumbago with sciatica, left side: Secondary | ICD-10-CM | POA: Diagnosis not present

## 2014-11-24 DIAGNOSIS — R079 Chest pain, unspecified: Secondary | ICD-10-CM | POA: Diagnosis not present

## 2014-11-24 DIAGNOSIS — Z90710 Acquired absence of both cervix and uterus: Secondary | ICD-10-CM | POA: Diagnosis not present

## 2014-11-24 DIAGNOSIS — D696 Thrombocytopenia, unspecified: Secondary | ICD-10-CM | POA: Insufficient documentation

## 2014-11-24 DIAGNOSIS — K7581 Nonalcoholic steatohepatitis (NASH): Secondary | ICD-10-CM | POA: Diagnosis not present

## 2014-11-24 DIAGNOSIS — Z79899 Other long term (current) drug therapy: Secondary | ICD-10-CM | POA: Insufficient documentation

## 2014-11-24 DIAGNOSIS — M79605 Pain in left leg: Secondary | ICD-10-CM | POA: Diagnosis present

## 2014-11-24 DIAGNOSIS — M1990 Unspecified osteoarthritis, unspecified site: Secondary | ICD-10-CM | POA: Diagnosis not present

## 2014-11-24 DIAGNOSIS — Z9049 Acquired absence of other specified parts of digestive tract: Secondary | ICD-10-CM | POA: Insufficient documentation

## 2014-11-24 DIAGNOSIS — Z791 Long term (current) use of non-steroidal anti-inflammatories (NSAID): Secondary | ICD-10-CM | POA: Diagnosis not present

## 2014-11-24 NOTE — ED Notes (Signed)
Triage completed via interpreter service.  Patient c/o left leg pain from waist to toes.  Denies injury; states pain has been going on for several weeks.

## 2014-11-25 ENCOUNTER — Encounter (HOSPITAL_COMMUNITY): Payer: Self-pay | Admitting: Internal Medicine

## 2014-11-25 ENCOUNTER — Emergency Department (HOSPITAL_COMMUNITY): Payer: PRIVATE HEALTH INSURANCE

## 2014-11-25 DIAGNOSIS — D696 Thrombocytopenia, unspecified: Secondary | ICD-10-CM

## 2014-11-25 DIAGNOSIS — R079 Chest pain, unspecified: Secondary | ICD-10-CM

## 2014-11-25 DIAGNOSIS — I348 Other nonrheumatic mitral valve disorders: Secondary | ICD-10-CM

## 2014-11-25 DIAGNOSIS — K7469 Other cirrhosis of liver: Secondary | ICD-10-CM

## 2014-11-25 LAB — COMPREHENSIVE METABOLIC PANEL
ALBUMIN: 2.9 g/dL — AB (ref 3.5–5.2)
ALK PHOS: 155 U/L — AB (ref 39–117)
ALT: 35 U/L (ref 0–35)
ALT: 33 U/L (ref 0–35)
ANION GAP: 0 — AB (ref 5–15)
ANION GAP: 5 (ref 5–15)
AST: 66 U/L — ABNORMAL HIGH (ref 0–37)
AST: 63 U/L — AB (ref 0–37)
Albumin: 2.6 g/dL — ABNORMAL LOW (ref 3.5–5.2)
Alkaline Phosphatase: 141 U/L — ABNORMAL HIGH (ref 39–117)
BILIRUBIN TOTAL: 1.4 mg/dL — AB (ref 0.3–1.2)
BUN: 15 mg/dL (ref 6–23)
BUN: 13 mg/dL (ref 6–23)
CALCIUM: 9.5 mg/dL (ref 8.4–10.5)
CO2: 21 mmol/L (ref 19–32)
CO2: 25 mmol/L (ref 19–32)
CREATININE: 0.57 mg/dL (ref 0.50–1.10)
Calcium: 9.3 mg/dL (ref 8.4–10.5)
Chloride: 108 mmol/L (ref 96–112)
Chloride: 113 mmol/L — ABNORMAL HIGH (ref 96–112)
Creatinine, Ser: 0.48 mg/dL — ABNORMAL LOW (ref 0.50–1.10)
GFR calc non Af Amer: >90 mL/min (ref >90)
GLUCOSE: 88 mg/dL (ref 70–99)
Glucose, Bld: 71 mg/dL (ref 70–99)
POTASSIUM: 4.2 mmol/L (ref 3.5–5.1)
Potassium: 4.4 mmol/L (ref 3.5–5.1)
SODIUM: 139 mmol/L (ref 135–145)
Sodium: 133 mmol/L — ABNORMAL LOW (ref 135–145)
Total Bilirubin: 1.5 mg/dL — ABNORMAL HIGH (ref 0.3–1.2)
Total Protein: 6.8 g/dL (ref 6.0–8.3)
Total Protein: 6.2 g/dL (ref 6.0–8.3)

## 2014-11-25 LAB — CBC
HCT: 39.5 % (ref 36.0–46.0)
Hemoglobin: 12.8 g/dL (ref 12.0–15.0)
MCH: 32.9 pg (ref 26.0–34.0)
MCHC: 32.4 g/dL (ref 30.0–36.0)
MCV: 101.5 fL — ABNORMAL HIGH (ref 78.0–100.0)
Platelets: 56 K/uL — ABNORMAL LOW (ref 150–400)
RBC: 3.89 MIL/uL (ref 3.87–5.11)
RDW: 14.7 % (ref 11.5–15.5)
WBC: 4.4 K/uL (ref 4.0–10.5)

## 2014-11-25 LAB — TROPONIN I: Troponin I: <0.03 ng/mL

## 2014-11-25 LAB — CBC WITH DIFFERENTIAL/PLATELET
BASOS ABS: 0 10*3/uL (ref 0.0–0.1)
Basophils Relative: 1 % (ref 0–1)
EOS ABS: 0.3 10*3/uL (ref 0.0–0.7)
Eosinophils Relative: 7 % — ABNORMAL HIGH (ref 0–5)
HEMATOCRIT: 41.6 % (ref 36.0–46.0)
Hemoglobin: 14 g/dL (ref 12.0–15.0)
LYMPHS ABS: 1.6 10*3/uL (ref 0.7–4.0)
LYMPHS PCT: 35 % (ref 12–46)
MCH: 34.1 pg — ABNORMAL HIGH (ref 26.0–34.0)
MCHC: 33.7 g/dL (ref 30.0–36.0)
MCV: 101.5 fL — AB (ref 78.0–100.0)
MONO ABS: 0.4 10*3/uL (ref 0.1–1.0)
Monocytes Relative: 9 % (ref 3–12)
Neutro Abs: 2.2 10*3/uL (ref 1.7–7.7)
Neutrophils Relative %: 48 % (ref 43–77)
Platelets: 61 10*3/uL — ABNORMAL LOW (ref 150–400)
RBC: 4.1 MIL/uL (ref 3.87–5.11)
RDW: 14.5 % (ref 11.5–15.5)
WBC: 4.6 10*3/uL (ref 4.0–10.5)

## 2014-11-25 LAB — PROTIME-INR
INR: 1.35 (ref 0.00–1.49)
Prothrombin Time: 16.8 seconds — ABNORMAL HIGH (ref 11.6–15.2)

## 2014-11-25 LAB — LIPID PANEL
Cholesterol: 147 mg/dL (ref 0–200)
HDL: 37 mg/dL — ABNORMAL LOW
LDL Cholesterol: 96 mg/dL (ref 0–99)
Total CHOL/HDL Ratio: 4 ratio
Triglycerides: 71 mg/dL
VLDL: 14 mg/dL (ref 0–40)

## 2014-11-25 MED ORDER — OXYCODONE HCL 5 MG PO TABS
5.0000 mg | ORAL_TABLET | Freq: Once | ORAL | Status: AC
  Administered 2014-11-25: 5 mg via ORAL
  Filled 2014-11-25: qty 1

## 2014-11-25 MED ORDER — PNEUMOCOCCAL VAC POLYVALENT 25 MCG/0.5ML IJ INJ
0.5000 mL | INJECTION | INTRAMUSCULAR | Status: DC
  Filled 2014-11-25: qty 0.5

## 2014-11-25 MED ORDER — ONDANSETRON HCL 4 MG PO TABS
4.0000 mg | ORAL_TABLET | Freq: Three times a day (TID) | ORAL | Status: DC | PRN
Start: 2014-11-25 — End: 2014-11-25

## 2014-11-25 MED ORDER — RIFAXIMIN 550 MG PO TABS
550.0000 mg | ORAL_TABLET | Freq: Two times a day (BID) | ORAL | Status: DC
Start: 2014-11-25 — End: 2014-11-25
  Administered 2014-11-25: 550 mg via ORAL
  Filled 2014-11-25: qty 1

## 2014-11-25 MED ORDER — ASPIRIN EC 81 MG PO TBEC
81.0000 mg | DELAYED_RELEASE_TABLET | Freq: Once | ORAL | Status: AC
  Administered 2014-11-25: 81 mg via ORAL
  Filled 2014-11-25: qty 1

## 2014-11-25 MED ORDER — PANTOPRAZOLE SODIUM 40 MG PO TBEC
40.0000 mg | DELAYED_RELEASE_TABLET | Freq: Every day | ORAL | Status: DC
  Administered 2014-11-25: 40 mg via ORAL
  Filled 2014-11-25: qty 1

## 2014-11-25 MED ORDER — ATORVASTATIN CALCIUM 40 MG PO TABS
40.0000 mg | ORAL_TABLET | Freq: Once | ORAL | Status: AC
  Administered 2014-11-25: 40 mg via ORAL
  Filled 2014-11-25: qty 1

## 2014-11-25 MED ORDER — LACTULOSE 10 GM/15ML PO SOLN
20.0000 g | Freq: Three times a day (TID) | ORAL | Status: DC
Start: 2014-11-25 — End: 2014-11-25
  Administered 2014-11-25 (×2): 20 g via ORAL
  Filled 2014-11-25 (×2): qty 30

## 2014-11-25 MED ORDER — SODIUM CHLORIDE 0.9 % IJ SOLN
3.0000 mL | Freq: Two times a day (BID) | INTRAMUSCULAR | Status: DC
  Administered 2014-11-25: 3 mL via INTRAVENOUS

## 2014-11-25 MED ORDER — OXYCODONE HCL 5 MG PO TABS: 5.0000 mg | ORAL_TABLET | Freq: Four times a day (QID) | ORAL | Status: DC | PRN

## 2014-11-25 MED ORDER — CYCLOBENZAPRINE HCL 10 MG PO TABS
10.0000 mg | ORAL_TABLET | Freq: Once | ORAL | Status: AC
  Administered 2014-11-25: 10 mg via ORAL
  Filled 2014-11-25: qty 1

## 2014-11-25 MED ORDER — NITROGLYCERIN 0.4 MG SL SUBL
0.4000 mg | SUBLINGUAL_TABLET | SUBLINGUAL | Status: DC | PRN
  Administered 2014-11-25 (×2): 0.4 mg via SUBLINGUAL
  Filled 2014-11-25: qty 1

## 2014-11-25 MED ORDER — SODIUM CHLORIDE 0.9 % IV SOLN
INTRAVENOUS | Status: DC
  Administered 2014-11-25: 04:00:00 via INTRAVENOUS

## 2014-11-25 MED ORDER — TRAMADOL HCL 50 MG PO TABS: 50.0000 mg | ORAL_TABLET | Freq: Four times a day (QID) | ORAL | Status: DC | PRN

## 2014-11-25 NOTE — H&P (Addendum)
Dawn Beard is an 59 y.o. female.    Delman Cheadle PA  Chief Complaint: back pain HPI: 59 yo female with hx of nash, cva, apparently presents due to back pain x1 yr,  Worse today.  Pt has had prior ESI.  When she arrived to ED, pt noted that she had chest pain (left sided), starting about 8pm, with radiation to the left arm, which she described as heavy.  Pt denies fever, chills, cough, palp, sob, n/v, diarrhea, brbpr, black stool. Pt notes that slg nitro helped after a few minutes.  EKG showed nsr, nl axis, no st - t changes c/w ischemia. CXR negative for any acute process. Trop negative.  Pt will be admitted for cp.   Past Medical History  Diagnosis Date  . Arthritis   . Stroke     slight  . Cirrhosis     likely NASH  . History of TIAs     Past Surgical History  Procedure Laterality Date  . Appendectomy    . External ear surgery    . Abdominal hysterectomy    . Colonoscopy N/A 07/04/2014    Dr. Vivi Ferns  . Esophagogastroduodenoscopy N/A 07/04/2014    Dr. Rourk:normal esophagus, query portal gastropathy, chronic atrophic gastritis with intestinal metaplasia, screening EGD in 2-3 years    Family History  Problem Relation Age of Onset  . Liver cancer Mother     ? pancreatic cancer  . Colon cancer Neg Hx    Social History:  reports that she has never smoked. She has never used smokeless tobacco. She reports that she does not drink alcohol or use illicit drugs.  Allergies:  Allergies  Allergen Reactions  . Shrimp [Shellfish Allergy] Itching and Rash  Medications reviewed   (Not in a hospital admission)  Results for orders placed or performed during the hospital encounter of 11/24/14 (from the past 48 hour(s))  Troponin I     Status: None   Collection Time: 11/25/14 12:48 AM  Result Value Ref Range   Troponin I <0.03 <0.031 ng/mL    Comment:        NO INDICATION OF MYOCARDIAL INJURY.   CBC with Differential     Status: Abnormal   Collection Time:  11/25/14 12:48 AM  Result Value Ref Range   WBC 4.6 4.0 - 10.5 K/uL   RBC 4.10 3.87 - 5.11 MIL/uL   Hemoglobin 14.0 12.0 - 15.0 g/dL   HCT 41.6 36.0 - 46.0 %   MCV 101.5 (H) 78.0 - 100.0 fL   MCH 34.1 (H) 26.0 - 34.0 pg   MCHC 33.7 30.0 - 36.0 g/dL   RDW 14.5 11.5 - 15.5 %   Platelets 61 (L) 150 - 400 K/uL    Comment: SPECIMEN CHECKED FOR CLOTS   Neutrophils Relative % 48 43 - 77 %   Neutro Abs 2.2 1.7 - 7.7 K/uL   Lymphocytes Relative 35 12 - 46 %   Lymphs Abs 1.6 0.7 - 4.0 K/uL   Monocytes Relative 9 3 - 12 %   Monocytes Absolute 0.4 0.1 - 1.0 K/uL   Eosinophils Relative 7 (H) 0 - 5 %   Eosinophils Absolute 0.3 0.0 - 0.7 K/uL   Basophils Relative 1 0 - 1 %   Basophils Absolute 0.0 0.0 - 0.1 K/uL  Protime-INR     Status: Abnormal   Collection Time: 11/25/14 12:48 AM  Result Value Ref Range   Prothrombin Time 16.8 (H) 11.6 - 15.2 seconds  INR 1.35 0.00 - 1.49  Comprehensive metabolic panel     Status: Abnormal   Collection Time: 11/25/14 12:48 AM  Result Value Ref Range   Sodium 133 (L) 135 - 145 mmol/L   Potassium 4.4 3.5 - 5.1 mmol/L   Chloride 108 96 - 112 mmol/L   CO2 25 19 - 32 mmol/L   Glucose, Bld 88 70 - 99 mg/dL   BUN 15 6 - 23 mg/dL   Creatinine, Ser 0.57 0.50 - 1.10 mg/dL   Calcium 9.3 8.4 - 10.5 mg/dL   Total Protein 6.8 6.0 - 8.3 g/dL   Albumin 2.9 (L) 3.5 - 5.2 g/dL   AST 66 (H) 0 - 37 U/L   ALT 35 0 - 35 U/L   Alkaline Phosphatase 155 (H) 39 - 117 U/L   Total Bilirubin 1.5 (H) 0.3 - 1.2 mg/dL   GFR calc non Af Amer >90 >90 mL/min   GFR calc Af Amer >90 >90 mL/min    Comment: (NOTE) The eGFR has been calculated using the CKD EPI equation. This calculation has not been validated in all clinical situations. eGFR's persistently <90 mL/min signify possible Chronic Kidney Disease.    Anion gap 0 (L) 5 - 15   Dg Chest 2 View  11/25/2014   CLINICAL DATA:  Radiating left leg pain, onset several weeks, extending down to the toes. Pain is progressively  worse today. Numbness in the leg.  EXAM: CHEST  2 VIEW  COMPARISON:  10/20/2014  FINDINGS: Mild cardiac enlargement with mild prominence of pulmonary vascularity. This may suggest early congestive changes. No airspace disease to suggest edema or pneumonia. No blunting of costophrenic angles. No pneumothorax. Mild thoracic scoliosis convex towards the right. Tortuous aorta.  IMPRESSION: Cardiac enlargement with mild pulmonary vascular congestion suggested. No edema or consolidation.   Electronically Signed   By: Lucienne Capers M.D.   On: 11/25/2014 01:57    Review of Systems  Constitutional: Negative for fever, chills, weight loss, malaise/fatigue and diaphoresis.  HENT: Negative for congestion, ear discharge, ear pain, hearing loss, nosebleeds, sore throat and tinnitus.   Eyes: Negative.   Respiratory: Negative.  Negative for stridor.   Cardiovascular: Positive for chest pain. Negative for palpitations, orthopnea, claudication, leg swelling and PND.  Gastrointestinal: Negative.   Genitourinary: Negative.   Musculoskeletal: Positive for back pain. Negative for myalgias, joint pain, falls and neck pain.  Skin: Negative for itching and rash.  Neurological: Negative.  Negative for weakness and headaches.  Endo/Heme/Allergies: Negative for environmental allergies and polydipsia. Does not bruise/bleed easily.  Psychiatric/Behavioral: Negative.     Blood pressure 112/64, pulse 80, temperature 99 F (37.2 C), temperature source Oral, resp. rate 12, height _0  (1.626 m), weight 103.42 kg (228 lb), SpO2 97 %. Physical Exam  Constitutional: She is oriented to person, place, and time. She appears well-developed and well-nourished.  HENT:  Head: Normocephalic and atraumatic.  Mouth/Throat: No oropharyngeal exudate.  Eyes: Conjunctivae and EOM are normal. Pupils are equal, round, and reactive to light. No scleral icterus.  Neck: Normal range of motion. Neck supple. No JVD present. No tracheal  deviation present. No thyromegaly present.  Cardiovascular: Normal rate and regular rhythm.  Exam reveals no gallop and no friction rub.   Murmur heard. 2/6 lusb, apex  Respiratory: Effort normal and breath sounds normal. No respiratory distress. She has no wheezes. She has no rales.  GI: Soft. Bowel sounds are normal. She exhibits no distension. There is no tenderness. There  is no rebound and no guarding.  Musculoskeletal: Normal range of motion. She exhibits no edema or tenderness.  Lymphadenopathy:    She has no cervical adenopathy.  Neurological: She is alert and oriented to person, place, and time. She has normal reflexes. She displays normal reflexes. No cranial nerve deficit. She exhibits normal muscle tone. Coordination normal.  Skin: Skin is warm and dry. No rash noted. No erythema. No pallor.  Psychiatric: She has a normal mood and affect. Her behavior is normal. Judgment and thought content normal.     Assessment/Plan Chest pain Telemetry NPO Aspirin 44m po x1 lipitor 459mpo x1 No b blocker due to relative bradycardia,  Stress testing ( I have not ordered because not sure what type of stress testing is available this weekend)  Hyponatremia Ns iv  Thrombocytopenia Check cbc in am  Back pain Cont to monitor  DVT prophylaxis: SCD, no lovenox due to thrombocytopenia   KIJani Gravel/28/2016, 3:26 AM

## 2014-11-25 NOTE — ED Provider Notes (Signed)
CSN: [id number]     Arrival date & time 11/24/14  2336 History  This chart was scribed for Dawn Fuel, MD by Martinique Peace, ED Scribe. The patient was seen in [place]. The patient's care was started at 12:19 AM.    Chief Complaint  Patient presents with  . Leg Pain      Patient is a 59 y.o. female presenting with leg pain. The history is provided by the patient. No language interpreter was used.  Leg Pain Location:  Hip Hip location:  L hip Pain details:    Radiates to:  L leg Associated symptoms: back pain    HPI Comments: Dawn Beard is a 59 y.o. female who presents to the Emergency Department complaining of radiating left leg pain onset several weeks that extends down into her toes. Pt reports pain has gotten progressively worse today which is caused her to come into ED tonight to be seen. She also complains of numbness in affected leg. No complaints of urinary issues or bowel incontinence. She denies any recent falls or mechanisms of injury that could be responsible for current pain. Her relative states pt was prescribed pain medication by PCP is Dr. Delman Cheadle but adds that she has been experiencing no relief. History of scoliosis and cirrhosis. Pt is non-smoker.   Pt also complains of chest pain that she describes as being "internal". Pain onset around 30 minutes ago. No complaints of nausea or difficulty breathing.    Past Medical History  Diagnosis Date  . Arthritis   . Stroke     slight  . Cirrhosis     likely NASH  . History of TIAs    Past Surgical History  Procedure Laterality Date  . Appendectomy    . External ear surgery    . Abdominal hysterectomy    . Colonoscopy N/A 07/04/2014    Dr. Vivi Ferns  . Esophagogastroduodenoscopy N/A 07/04/2014    Dr. Rourk:normal esophagus, query portal gastropathy, chronic atrophic gastritis with intestinal metaplasia, screening EGD in 2-3 years   Family History  Problem Relation Age of Onset  . Liver  cancer Mother   . Colon cancer Neg Hx    History  Substance Use Topics  . Smoking status: Never Smoker   . Smokeless tobacco: Never Used     Comment: Never smoked  . Alcohol Use: No   OB History    Gravida Para Term Preterm AB TAB SAB Ectopic Multiple Living   5 5 5       5      Review of Systems  Respiratory: Negative for shortness of breath.   Cardiovascular: Positive for chest pain.  Gastrointestinal: Negative for nausea, diarrhea and constipation.  Genitourinary: Negative for dysuria, urgency, decreased urine volume and difficulty urinating.  Musculoskeletal: Positive for back pain.       Left leg pain.   Neurological: Positive for numbness.      Allergies  Shrimp  Home Medications   Prior to Admission medications   Medication Sig Start Date End Date Taking? Authorizing Provider  lactulose (CHRONULAC) 10 GM/15ML solution Take 30 to 45 ml 3 to 4 times a day to have 2 to 3 soft bowel movements a day. 10/24/14   Orvil Feil, NP  meloxicam (MOBIC) 15 MG tablet Take 15 mg by mouth daily.    Historical Provider, MD  omeprazole (PRILOSEC) 20 MG capsule Take 1 capsule (20 mg total) by mouth daily. 30 minutes before breakfast 10/04/14   Leandra Kern  Sams, NP  ondansetron (ZOFRAN) 4 MG tablet Take 1 tablet (4 mg total) by mouth every 8 (eight) hours as needed for nausea or vomiting. 10/04/14   Orvil Feil, NP  rifaximin (XIFAXAN) 550 MG TABS tablet Take 1 tablet (550 mg total) by mouth 2 (two) times daily. 10/24/14   Orvil Feil, NP  traMADol (ULTRAM) 50 MG tablet Take 50 mg by mouth every 6 (six) hours as needed for moderate pain or severe pain.     Historical Provider, MD   BP 130/64 mmHg  Pulse 65  Temp(Src) 99 F (37.2 C) (Oral)  Resp 20  Ht 5\' 4"  (1.626 m)  Wt 228 lb (103.42 kg)  BMI 39.12 kg/m2  SpO2 100% Physical Exam  Constitutional: She is oriented to person, place, and time. She appears well-developed and well-nourished. No distress.  HENT:  Head: Normocephalic and  atraumatic.  Eyes: Conjunctivae and EOM are normal. Pupils are equal, round, and reactive to light.  Neck: Normal range of motion. Neck supple. No JVD present.  Cardiovascular: Normal rate, regular rhythm and normal heart sounds.   No murmur heard. Pulmonary/Chest: Effort normal and breath sounds normal. She has no wheezes. She has no rales. She exhibits tenderness.  Moderate bilateral peristernal tenderness. Does not reproduce pain.   Abdominal: Soft. Bowel sounds are normal. She exhibits no distension and no mass. There is no tenderness.  Musculoskeletal: Normal range of motion. She exhibits tenderness. She exhibits no edema.  Moderate tenderness L Spine and L peri lumbar area. Positive left straight leg raise at 15 degrees.   Lymphadenopathy:    She has no cervical adenopathy.  Neurological: She is alert and oriented to person, place, and time. No cranial nerve deficit. Coordination normal.  No asterixis   Skin: Skin is warm and dry. No rash noted.  Psychiatric: She has a normal mood and affect. Her behavior is normal. Thought content normal.  Nursing note and vitals reviewed.   ED Course  Procedures (including critical care time) Labs Review Results for orders placed or performed during the hospital encounter of 11/24/14  Troponin I  Result Value Ref Range   Troponin I <0.03 <0.031 ng/mL  CBC with Differential  Result Value Ref Range   WBC 4.6 4.0 - 10.5 K/uL   RBC 4.10 3.87 - 5.11 MIL/uL   Hemoglobin 14.0 12.0 - 15.0 g/dL   HCT 41.6 36.0 - 46.0 %   MCV 101.5 (H) 78.0 - 100.0 fL   MCH 34.1 (H) 26.0 - 34.0 pg   MCHC 33.7 30.0 - 36.0 g/dL   RDW 14.5 11.5 - 15.5 %   Platelets 61 (L) 150 - 400 K/uL   Neutrophils Relative % 48 43 - 77 %   Neutro Abs 2.2 1.7 - 7.7 K/uL   Lymphocytes Relative 35 12 - 46 %   Lymphs Abs 1.6 0.7 - 4.0 K/uL   Monocytes Relative 9 3 - 12 %   Monocytes Absolute 0.4 0.1 - 1.0 K/uL   Eosinophils Relative 7 (H) 0 - 5 %   Eosinophils Absolute 0.3 0.0 -  0.7 K/uL   Basophils Relative 1 0 - 1 %   Basophils Absolute 0.0 0.0 - 0.1 K/uL  Protime-INR  Result Value Ref Range   Prothrombin Time 16.8 (H) 11.6 - 15.2 seconds   INR 1.35 0.00 - 1.49  Comprehensive metabolic panel  Result Value Ref Range   Sodium 133 (L) 135 - 145 mmol/L   Potassium 4.4 3.5 -  5.1 mmol/L   Chloride 108 96 - 112 mmol/L   CO2 25 19 - 32 mmol/L   Glucose, Bld 88 70 - 99 mg/dL   BUN 15 6 - 23 mg/dL   Creatinine, Ser 0.57 0.50 - 1.10 mg/dL   Calcium 9.3 8.4 - 10.5 mg/dL   Total Protein 6.8 6.0 - 8.3 g/dL   Albumin 2.9 (L) 3.5 - 5.2 g/dL   AST 66 (H) 0 - 37 U/L   ALT 35 0 - 35 U/L   Alkaline Phosphatase 155 (H) 39 - 117 U/L   Total Bilirubin 1.5 (H) 0.3 - 1.2 mg/dL   GFR calc non Af Amer >90 >90 mL/min   GFR calc Af Amer >90 >90 mL/min   Anion gap 0 (L) 5 - 15  Troponin I (q 6hr x 3)  Result Value Ref Range   Troponin I <0.03 <0.031 ng/mL  Lipid panel  Result Value Ref Range   Cholesterol 147 0 - 200 mg/dL   Triglycerides 71 <150 mg/dL   HDL 37 (L) >39 mg/dL   Total CHOL/HDL Ratio 4.0 RATIO   VLDL 14 0 - 40 mg/dL   LDL Cholesterol 96 0 - 99 mg/dL  Comprehensive metabolic panel  Result Value Ref Range   Sodium 139 135 - 145 mmol/L   Potassium 4.2 3.5 - 5.1 mmol/L   Chloride 113 (H) 96 - 112 mmol/L   CO2 21 19 - 32 mmol/L   Glucose, Bld 71 70 - 99 mg/dL   BUN 13 6 - 23 mg/dL   Creatinine, Ser 0.48 (L) 0.50 - 1.10 mg/dL   Calcium 9.5 8.4 - 10.5 mg/dL   Total Protein 6.2 6.0 - 8.3 g/dL   Albumin 2.6 (L) 3.5 - 5.2 g/dL   AST 63 (H) 0 - 37 U/L   ALT 33 0 - 35 U/L   Alkaline Phosphatase 141 (H) 39 - 117 U/L   Total Bilirubin 1.4 (H) 0.3 - 1.2 mg/dL   GFR calc non Af Amer >90 >90 mL/min   GFR calc Af Amer >90 >90 mL/min   Anion gap 5 5 - 15  CBC  Result Value Ref Range   WBC 4.4 4.0 - 10.5 K/uL   RBC 3.89 3.87 - 5.11 MIL/uL   Hemoglobin 12.8 12.0 - 15.0 g/dL   HCT 39.5 36.0 - 46.0 %   MCV 101.5 (H) 78.0 - 100.0 fL   MCH 32.9 26.0 - 34.0 pg   MCHC  32.4 30.0 - 36.0 g/dL   RDW 14.7 11.5 - 15.5 %   Platelets 56 (L) 150 - 400 K/uL   Imaging Review Dg Chest 2 View  11/25/2014   CLINICAL DATA:  Radiating left leg pain, onset several weeks, extending down to the toes. Pain is progressively worse today. Numbness in the leg.  EXAM: CHEST  2 VIEW  COMPARISON:  10/20/2014  FINDINGS: Mild cardiac enlargement with mild prominence of pulmonary vascularity. This may suggest early congestive changes. No airspace disease to suggest edema or pneumonia. No blunting of costophrenic angles. No pneumothorax. Mild thoracic scoliosis convex towards the right. Tortuous aorta.  IMPRESSION: Cardiac enlargement with mild pulmonary vascular congestion suggested. No edema or consolidation.   Electronically Signed   By: Lucienne Capers M.D.   On: 11/25/2014 01:57     EKG Interpretation   Date/Time:  Sunday November 25 2014 00:46:05 EST Ventricular Rate:  68 PR Interval:  162 QRS Duration: 102 QT Interval:  421 QTC Calculation: 448 R  Axis:   -7 Text Interpretation:  Sinus rhythm Baseline wander in lead(s) V6 When  compared with ECG of 10/20/2014, No significant change was found Confirmed  by [facility]  MD, Verdine Grenfell ([id number]) on 11/25/2014 1:03:14 AM     Medications - No data to display  12:25 AM- Treatment plan was discussed with patient who verbalizes understanding and agrees.   MDM   Final diagnoses:  Chest pain, unspecified chest pain type  Left-sided low back pain with left-sided sciatica  NASH (nonalcoholic steatohepatitis)  Thrombocytopenia    Back and leg pain are clearly sciatica. I have reviewed a prior CT of abdomen and pelvis which showed significant degenerative changes as well as presence of scoliosis. Chest pain is little worrisome. She had complete relief of pain with nitroglycerin. Aspirin is not given because of thrombocytopenia. ECG and troponin are unremarkable. It is elected to admit her for cardiac monitoring and serial troponins. Case is  discussed with Dr. Maudie Mercury of [facility] who agrees to admit the patient under observation status.  I personally performed the services described in this documentation, which was scribed in my presence. The recorded information has been reviewed and is accurate.      Dawn Fuel, MD 25/05/39 [id number]

## 2014-11-25 NOTE — ED Notes (Signed)
MD at bedside. 

## 2014-11-25 NOTE — Discharge Summary (Signed)
Physician Discharge Summary  Dawn Beard YNW:[id number] DOB: 1956-06-11 DOA: 11/24/2014  PCP: Jana Half  Admit date: 11/24/2014 Discharge date: 11/25/2014  Time spent: 45 minutes  Recommendations for Outpatient Follow-up:  -Will be discharged home today. -Advised to follow up with PCP in 2 weeks.   Discharge Diagnoses:  Active Problems:   Thrombocytopenia   Cirrhosis   Pain in the chest   Chest pain   Discharge Condition: Stable and improved  Filed Weights   11/24/14 2347 11/25/14 0421  Weight: 103.42 kg (228 lb) 103.1 kg (227 lb 4.7 oz)    History of present illness:  59 yo female with hx of nash, cva, apparently presents due to back pain x1 yr, Worse today. Pt has had prior ESI. When she arrived to ED, pt noted that she had chest pain (left sided), starting about 8pm, with radiation to the left arm, which she described as heavy. Pt denies fever, chills, cough, palp, sob, n/v, diarrhea, brbpr, black stool. Pt notes that slg nitro helped after a few minutes. EKG showed nsr, nl axis, no st - t changes c/w ischemia. CXR negative for any acute process. Trop negative. Pt will be admitted for cp.   Hospital Course:   Chest Pain -Ruled out for ACS by troponins and non acute EKG. -No further cardiac work up planned this admission.  Rest of chronic issues stable. Home medications have not changed.  Procedures:  None   Consultations:  None  Discharge Instructions  Discharge Instructions    Increase activity slowly    Complete by:  As directed             Medication List    TAKE these medications        lactulose 10 GM/15ML solution  Commonly known as:  CHRONULAC  Take 30 to 45 ml 3 to 4 times a day to have 2 to 3 soft bowel movements a day.     meloxicam 15 MG tablet  Commonly known as:  MOBIC  Take 15 mg by mouth daily.     oxyCODONE 5 MG immediate release tablet  Commonly known as:  Oxy IR/ROXICODONE  Take 1 tablet (5 mg  total) by mouth every 6 (six) hours as needed for severe pain.     rifaximin 550 MG Tabs tablet  Commonly known as:  XIFAXAN  Take 1 tablet (550 mg total) by mouth 2 (two) times daily.     traMADol 50 MG tablet  Commonly known as:  ULTRAM  Take 50 mg by mouth at bedtime.       Allergies  Allergen Reactions  . Shrimp [Shellfish Allergy] Itching and Rash       Follow-up Information    Follow up with Delman Cheadle, PA-C. Schedule an appointment as soon as possible for a visit in 2 weeks.   Specialty:  Family Medicine   Contact information:   [address] [place] [address] [phone]        The results of significant diagnostics from this hospitalization (including imaging, microbiology, ancillary and laboratory) are listed below for reference.    Significant Diagnostic Studies: Dg Chest 2 View  11/25/2014   CLINICAL DATA:  Radiating left leg pain, onset several weeks, extending down to the toes. Pain is progressively worse today. Numbness in the leg.  EXAM: CHEST  2 VIEW  COMPARISON:  10/20/2014  FINDINGS: Mild cardiac enlargement with mild prominence of pulmonary vascularity. This may suggest early congestive changes. No airspace disease  to suggest edema or pneumonia. No blunting of costophrenic angles. No pneumothorax. Mild thoracic scoliosis convex towards the right. Tortuous aorta.  IMPRESSION: Cardiac enlargement with mild pulmonary vascular congestion suggested. No edema or consolidation.   Electronically Signed   By: Lucienne Capers M.D.   On: 11/25/2014 01:57    Microbiology: No results found for this or any previous visit (from the past 240 hour(s)).   Labs: Basic Metabolic Panel:  Recent Labs Lab 11/25/14 0048 11/25/14 0504  NA 133* 139  K 4.4 4.2  CL 108 113*  CO2 25 21  GLUCOSE 88 71  BUN 15 13  CREATININE 0.57 0.48*  CALCIUM 9.3 9.5   Liver Function Tests:  Recent Labs Lab 11/25/14 0048 11/25/14 0504  AST 66* 63*  ALT 35 33    ALKPHOS 155* 141*  BILITOT 1.5* 1.4*  PROT 6.8 6.2  ALBUMIN 2.9* 2.6*   No results for input(s): LIPASE, AMYLASE in the last 168 hours. No results for input(s): AMMONIA in the last 168 hours. CBC:  Recent Labs Lab 11/25/14 0048 11/25/14 0504  WBC 4.6 4.4  NEUTROABS 2.2  --   HGB 14.0 12.8  HCT 41.6 39.5  MCV 101.5* 101.5*  PLT 61* 56*   Cardiac Enzymes:  Recent Labs Lab 11/25/14 0048 11/25/14 0504 11/25/14 0957 11/25/14 1555  TROPONINI <0.03 <0.03 <0.03 <0.03   BNP: BNP (last 3 results) No results for input(s): BNP in the last 8760 hours.  ProBNP (last 3 results)  Recent Labs  04/08/14 2220  PROBNP 618.2*    CBG: No results for input(s): GLUCAP in the last 168 hours.     SignedLelon Frohlich  [facility] Pager: [phone] 11/25/2014, 5:38 PM

## 2014-11-25 NOTE — Progress Notes (Signed)
NURSING PROGRESS NOTE  KIMORI TARTAGLIA [id number] Discharge Data: 11/25/2014 4:49 PM Attending Provider: No att. providers found MPN:[id number], PA-C   Cari Caraway to be D/C'd Home per MD order.    All IV's discontinued and monitored for bleeding.  All belongings returned to patient for patient to take home.  AVS summary and prescriptions reviewed in spanish with patient and family.   Patient left floor via wheelchair, escorted by NT.  Last Documented Vital Signs:  Blood pressure 96/44, pulse 64, temperature 98 F (36.7 C), temperature source Oral, resp. rate 20, height 5\' 4"  (1.626 m), weight 103.1 kg (227 lb 4.7 oz), SpO2 97 %.  Cecilie Kicks D

## 2014-11-25 NOTE — Progress Notes (Signed)
UR completed 

## 2014-11-25 NOTE — Progress Notes (Signed)
  Echocardiogram 2D Echocardiogram has been performed.  Dawn Beard 11/25/2014, 11:36 AM

## 2014-11-27 ENCOUNTER — Ambulatory Visit (INDEPENDENT_AMBULATORY_CARE_PROVIDER_SITE_OTHER): Payer: PRIVATE HEALTH INSURANCE | Admitting: Gastroenterology

## 2014-11-27 ENCOUNTER — Encounter: Payer: Self-pay | Admitting: Gastroenterology

## 2014-11-27 VITALS — BP 131/79 | HR 84 | Temp 98.4°F | Ht 64.0 in | Wt 224.0 lb

## 2014-11-27 DIAGNOSIS — K7469 Other cirrhosis of liver: Secondary | ICD-10-CM

## 2014-11-27 NOTE — Assessment & Plan Note (Signed)
59 year old female with recent episode of encephalopathy in Jan 2016, doing well on lactulose dosing and Xifaxan. Up-to-date on hepatoma screening and EGD. Return in 3 months for close follow-up.

## 2014-11-27 NOTE — Patient Instructions (Signed)
Keep up the good work!!  Continue to take lactulose three times a day. I want you to have a soft bowel movement at least three times a day.   Continue Xifaxan twice a day.   We will see you in 3 months!

## 2014-11-27 NOTE — Progress Notes (Signed)
CC'ED TO PCP 

## 2014-11-27 NOTE — Progress Notes (Signed)
    Primary Care Physician:  Jana Half  Primary GI: Dr. Gala Romney   Chief Complaint  Patient presents with  . elevated ammonia level  . get stomach checked    HPI:   Dawn Beard is a 59 y.o. female presenting today with a history of NASH cirrhosis. Recent EGD on file without varices; needs repeat screening in 2-3 years. Colonoscopy up-to-date and normal. Mental status changes prompting ED evaluation in Jan, with ammonia 106. Prescribed Lactulose and Xifaxan. Here for routine follow-up.   Husband states patient is doing better. No confusion. 3 soft bowel movements a day. Taking lactulose on average 3 times a day. On Xifaxan BID. No abdominal pain, rectal bleeding. Avoiding salt.   Past Medical History  Diagnosis Date  . Arthritis   . Stroke     slight  . Cirrhosis     likely NASH  . History of TIAs     Past Surgical History  Procedure Laterality Date  . Appendectomy    . External ear surgery    . Abdominal hysterectomy    . Colonoscopy N/A 07/04/2014    Dr. Vivi Ferns  . Esophagogastroduodenoscopy N/A 07/04/2014    Dr. Rourk:normal esophagus, query portal gastropathy, chronic atrophic gastritis with intestinal metaplasia, screening EGD in 2-3 years    Current Outpatient Prescriptions  Medication Sig Dispense Refill  . lactulose (CHRONULAC) 10 GM/15ML solution Take 30 to 45 ml 3 to 4 times a day to have 2 to 3 soft bowel movements a day. 1892 mL 3  . meloxicam (MOBIC) 15 MG tablet Take 15 mg by mouth daily.    . rifaximin (XIFAXAN) 550 MG TABS tablet Take 1 tablet (550 mg total) by mouth 2 (two) times daily. 60 tablet 3  . traMADol (ULTRAM) 50 MG tablet Take 50 mg by mouth at bedtime.      No current facility-administered medications for this visit.    Allergies as of 11/27/2014 - Review Complete 11/27/2014  Allergen Reaction Noted  . Shrimp [shellfish allergy] Itching and Rash 04/08/2014    Family History  Problem Relation Age of Onset  . Liver  cancer Mother     ? pancreatic cancer  . Colon cancer Neg Hx     History   Social History  . Marital Status: Married    Spouse Name: N/A  . Number of Children: N/A  . Years of Education: N/A   Social History Main Topics  . Smoking status: Never Smoker   . Smokeless tobacco: Never Used     Comment: Never smoked  . Alcohol Use: No  . Drug Use: No  . Sexual Activity: Not on file   Other Topics Concern  . None   Social History Narrative    Review of Systems: As mentioned in HPI.   Physical Exam: BP 131/79 mmHg  Pulse 84  Temp(Src) 98.4 F (36.9 C)  Ht 5\' 4"  (1.626 m)  Wt 224 lb (101.606 kg)  BMI 38.43 kg/m2 General:   Alert and oriented. No distress noted. Pleasant and cooperative.  Head:  Normocephalic and atraumatic. Eyes:  Conjuctiva clear without scleral icterus. Mouth:  Oral mucosa pink and moist. Good dentition. No lesions. Abdomen:  +BS, soft, non-tender and non-distended.Largely obese. Unable to appreciate HSM due to large AP diameter.  Msk:  Symmetrical without gross deformities.  Extremities:  Trace edema Neurologic:  Alert and  oriented x4 Psych:  Alert and cooperative. Normal mood and affect.

## 2014-12-18 ENCOUNTER — Ambulatory Visit (HOSPITAL_COMMUNITY)
Admission: RE | Admit: 2014-12-18 | Discharge: 2014-12-18 | Disposition: A | Discharge status: Home / Self Care | Payer: PRIVATE HEALTH INSURANCE | Source: Ambulatory Visit | Attending: Hematology & Oncology | Admitting: Hematology & Oncology

## 2014-12-18 ENCOUNTER — Other Ambulatory Visit (HOSPITAL_COMMUNITY): Payer: Self-pay | Admitting: Hematology & Oncology

## 2014-12-18 DIAGNOSIS — N63 Unspecified lump in breast: Secondary | ICD-10-CM | POA: Diagnosis present

## 2014-12-18 DIAGNOSIS — N6310 Unspecified lump in the right breast, unspecified quadrant: Secondary | ICD-10-CM

## 2014-12-18 DIAGNOSIS — Z09 Encounter for follow-up examination after completed treatment for conditions other than malignant neoplasm: Secondary | ICD-10-CM

## 2015-01-03 ENCOUNTER — Ambulatory Visit: Payer: PRIVATE HEALTH INSURANCE | Admitting: Gastroenterology

## 2015-01-03 ENCOUNTER — Encounter: Payer: PRIVATE HEALTH INSURANCE | Attending: Family Medicine | Admitting: Nutrition

## 2015-01-03 VITALS — Ht 64.0 in | Wt 222.0 lb

## 2015-01-03 DIAGNOSIS — K7469 Other cirrhosis of liver: Secondary | ICD-10-CM | POA: Diagnosis not present

## 2015-01-03 DIAGNOSIS — Z6838 Body mass index (BMI) 38.0-38.9, adult: Secondary | ICD-10-CM | POA: Insufficient documentation

## 2015-01-03 DIAGNOSIS — Z713 Dietary counseling and surveillance: Secondary | ICD-10-CM | POA: Insufficient documentation

## 2015-01-03 DIAGNOSIS — E669 Obesity, unspecified: Secondary | ICD-10-CM | POA: Diagnosis not present

## 2015-01-03 NOTE — Progress Notes (Signed)
  Medical Nutrition Therapy:  Appt start time: 1115 end time:  1130.  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 She is here today with her son who is her interpreter. Interpreter service when to [place] office. Pt. Refused need for interpreter and stated her son could interpret for her.   Eating more vegetables and fresh beans and eating more whole dried beans. Eating whole wheat bread now. Eating more fresh fruit. Drinking a lot more water. No sodas, juice or tea. Increasing her physical activity by walking and doing more chores around the house. Feels a lot better, not as sluggish not as tired. Reflex is a lot better too. Has been walking more and feels better. He son the family can see a big difference in her and are glad to see she is doing much better.     Lost 6 lbs since last visit.  Not using cane to walk today.   Diet has improved a lot. No more processed foods.    Diet needs more lower carb vegetables and fresh fruit.   Preferred Learning Style:   No preference indicated   Learning Readiness:    Ready  Change in progress  MEDICATIONS: See list   DIETARY INTAKE:  24-hr recall:  B ( AM): Cherrios with 2% milk,,water Snk ( AM): none L ( PM): Egg, and 1/2 c beans, apple, water, Snk ( PM): no D ( PM): Chicken fried, beans, Snk ( PM): Cherrios with 2% milk 1 cup Beverages: water  Usual physical activity: walking some now.  Estimated energy needs: 1500 calories 170 g carbohydrates 112 g protein 42 g fat  Progress Towards Goal(s):  In progress.   Nutritional Diagnosis:  NB-1.1 Food and nutrition-related knowledge deficit As related to Fatty LIver/NASH.  As evidenced by Obesity with BMI 39 and poor diet..    Intervention:  Nutrition counseling on a low fat, high fiber low sodium diet. Reviewed meal planning, portion sizes and importance of balanced meals using the Plate Method at scheduled times during the day.  Goal  Eat three balanced meals per day  according to the Plate Method. Avoid snacks Increase water intake to 5 bottles per day. Increase fresh fruits and vegetables-eat more cabbage, greens, salads, beets, squash zucchini and tomatoes. Exercise 15-30 minutes a day Lose 1 lb per week til next visit. Continue to avoid processed foods.  Teaching Method Utilized:  Visual Auditory Hands on  Handouts given during visit include:  The Plate Method  Meal Plan Card  Low Fat High Fiber Low Sodium Diet  Barriers to learning/adherence to lifestyle change: Walks with a cane: TIA  Demonstrated degree of understanding via:  Teach Back   Monitoring/Evaluation:  Dietary intake, exercise, meal planning, food journal, and body weight in 1 month(s).

## 2015-01-04 ENCOUNTER — Encounter: Payer: Self-pay | Admitting: Nutrition

## 2015-01-04 NOTE — Patient Instructions (Signed)
Goal  Eat three balanced meals per day according to the Plate Method. Avoid snacks Increase water intake to 5 bottles per day. Increase fresh fruits and vegetables-eat more cabbage, greens, salads, beets, squash zucchini and tomatoes. Exercise 15-30 minutes a day Lose 1 lb per week til next visit. Continue to avoid processed foods.

## 2015-02-27 ENCOUNTER — Encounter (INDEPENDENT_AMBULATORY_CARE_PROVIDER_SITE_OTHER): Payer: Self-pay

## 2015-02-27 ENCOUNTER — Ambulatory Visit (INDEPENDENT_AMBULATORY_CARE_PROVIDER_SITE_OTHER): Payer: PRIVATE HEALTH INSURANCE | Admitting: Gastroenterology

## 2015-02-27 ENCOUNTER — Encounter: Payer: Self-pay | Admitting: Gastroenterology

## 2015-02-27 VITALS — BP 136/76 | HR 65 | Temp 97.6°F | Ht 62.0 in | Wt 219.2 lb

## 2015-02-27 DIAGNOSIS — K7469 Other cirrhosis of liver: Secondary | ICD-10-CM

## 2015-02-27 MED ORDER — LACTULOSE 10 GM/15ML PO SOLN: ORAL | Status: DC

## 2015-02-27 MED ORDER — RIFAXIMIN 550 MG PO TABS: 550.0000 mg | ORAL_TABLET | Freq: Two times a day (BID) | ORAL | Status: DC

## 2015-02-27 NOTE — Progress Notes (Signed)
Referring Provider: Jake Samples, PA* Primary Care Physician:  Jana Half  Primary GI: Dr. Gala Romney    Chief Complaint  Patient presents with  . Follow-up    HPI:   Dawn Beard is a 59 y.o. female presenting today with a history of NASH cirrhosis. Recent EGD on file without varices; needs repeat screening in 2-3 years. Colonoscopy up-to-date and normal. Mental status changes prompting ED evaluation in Jan, with ammonia 106. Prescribed Lactulose and Xifaxan. Returns today in close follow-up.  Doing well. 3 soft bowel movements daily, taking lactulose and Xifaxan. No confusion or mental status changes. Denies abdominal pain, N/V, GERD exacerbations. Drove to [place] with husband due to brother's health. Very mild lower extremity edema after returning from 24 hour car ride, otherwise doing well.   Past Medical History  Diagnosis Date  . Arthritis   . Stroke     slight  . Cirrhosis     likely NASH  . History of TIAs     Past Surgical History  Procedure Laterality Date  . Appendectomy    . External ear surgery    . Abdominal hysterectomy    . Colonoscopy N/A 07/04/2014    Dr. Vivi Ferns  . Esophagogastroduodenoscopy N/A 07/04/2014    Dr. Rourk:normal esophagus, query portal gastropathy, chronic atrophic gastritis with intestinal metaplasia, screening EGD in 2-3 years    Current Outpatient Prescriptions  Medication Sig Dispense Refill  . lactulose (CHRONULAC) 10 GM/15ML solution Take 30 to 45 ml 3 to 4 times a day to have 2 to 3 soft bowel movements a day. 1892 mL 3  . meloxicam (MOBIC) 15 MG tablet Take 15 mg by mouth daily.    . rifaximin (XIFAXAN) 550 MG TABS tablet Take 1 tablet (550 mg total) by mouth 2 (two) times daily. 60 tablet 3  . traMADol (ULTRAM) 50 MG tablet Take 50 mg by mouth at bedtime.      No current facility-administered medications for this visit.    Allergies as of 02/27/2015 - Review Complete 01/04/2015  Allergen Reaction  Noted  . Shrimp [shellfish allergy] Itching and Rash 04/08/2014    Family History  Problem Relation Age of Onset  . Liver cancer Mother     ? pancreatic cancer  . Colon cancer Neg Hx     History   Social History  . Marital Status: Married    Spouse Name: N/A  . Number of Children: N/A  . Years of Education: N/A   Social History Main Topics  . Smoking status: Never Smoker   . Smokeless tobacco: Never Used     Comment: Never smoked  . Alcohol Use: No  . Drug Use: No  . Sexual Activity: Not on file   Other Topics Concern  . None   Social History Narrative    Review of Systems: As mentioned in HPI  Physical Exam: BP 136/76 mmHg  Pulse 65  Temp(Src) 97.6 F (36.4 C) (Oral)  Ht 5\' 2"  (1.575 m)  Wt 219 lb 3.2 oz (99.428 kg)  BMI 40.08 kg/m2 General:   Alert and oriented. No distress noted. Pleasant and cooperative.  Head:  Normocephalic and atraumatic. Eyes:  Conjuctiva clear without scleral icterus. Mouth:  Oral mucosa pink and moist. Good dentition. No lesions. Abdomen:  +BS, soft, non-tender and non-distended. Obese, unable to appreciate HSM due to larger AP diameter.  Msk:  Symmetrical without gross deformities. Normal posture. Extremities:  Without edema. Neurologic:  Alert and  oriented x4;  grossly normal neurologically. Psych:  Alert and cooperative. Normal mood and affect.

## 2015-02-27 NOTE — Patient Instructions (Addendum)
Take the lactulose twice a day (adjusting as needed) so that you have 2-3 soft bowel movements a day.   Continue Xifaxan twice a day.   We will set up an ultrasound of your liver for July 2016.   We will see you back in 6 months!

## 2015-02-27 NOTE — Assessment & Plan Note (Signed)
59 year old female with NASH cirrhosis, doing well now with 3 soft bowel movements daily on regimen of lactulose and xifaxan. NO further encephalopathic episodes since Jan 2016. Well-compensated at this time. Due for [place] for [facility] screening in July 2016. Return in 6 months.

## 2015-03-12 ENCOUNTER — Telehealth: Payer: Self-pay | Admitting: Internal Medicine

## 2015-03-12 NOTE — Telephone Encounter (Signed)
Letter in the mail 

## 2015-03-12 NOTE — Telephone Encounter (Signed)
July recall for 6 month U/S

## 2015-03-13 NOTE — Progress Notes (Signed)
cc'd to pcp 

## 2015-04-17 ENCOUNTER — Ambulatory Visit: Payer: PRIVATE HEALTH INSURANCE | Admitting: Nutrition

## 2015-04-17 ENCOUNTER — Telehealth: Payer: Self-pay | Admitting: Nutrition

## 2015-04-17 NOTE — Telephone Encounter (Signed)
TC to pt's husband. She is in [place]. Asked to call me back when she gets back in town to reschedule missed appointment. PC

## 2015-04-23 ENCOUNTER — Other Ambulatory Visit (HOSPITAL_COMMUNITY): Payer: PRIVATE HEALTH INSURANCE

## 2015-04-23 ENCOUNTER — Ambulatory Visit (HOSPITAL_COMMUNITY): Payer: PRIVATE HEALTH INSURANCE | Admitting: Hematology & Oncology

## 2015-05-06 ENCOUNTER — Encounter (HOSPITAL_COMMUNITY): Payer: PRIVATE HEALTH INSURANCE | Attending: Hematology & Oncology | Admitting: Hematology & Oncology

## 2015-05-06 ENCOUNTER — Encounter (HOSPITAL_BASED_OUTPATIENT_CLINIC_OR_DEPARTMENT_OTHER): Payer: PRIVATE HEALTH INSURANCE

## 2015-05-06 ENCOUNTER — Encounter (HOSPITAL_COMMUNITY): Payer: Self-pay | Admitting: Hematology & Oncology

## 2015-05-06 VITALS — BP 135/71 | HR 84 | Temp 98.0°F | Resp 20 | Wt 221.0 lb

## 2015-05-06 DIAGNOSIS — R161 Splenomegaly, not elsewhere classified: Secondary | ICD-10-CM | POA: Diagnosis not present

## 2015-05-06 DIAGNOSIS — K7469 Other cirrhosis of liver: Secondary | ICD-10-CM

## 2015-05-06 DIAGNOSIS — K7460 Unspecified cirrhosis of liver: Secondary | ICD-10-CM | POA: Diagnosis not present

## 2015-05-06 DIAGNOSIS — K7581 Nonalcoholic steatohepatitis (NASH): Secondary | ICD-10-CM

## 2015-05-06 DIAGNOSIS — N63 Unspecified lump in breast: Secondary | ICD-10-CM | POA: Insufficient documentation

## 2015-05-06 DIAGNOSIS — D696 Thrombocytopenia, unspecified: Secondary | ICD-10-CM

## 2015-05-06 DIAGNOSIS — N6310 Unspecified lump in the right breast, unspecified quadrant: Secondary | ICD-10-CM

## 2015-05-06 DIAGNOSIS — R928 Other abnormal and inconclusive findings on diagnostic imaging of breast: Secondary | ICD-10-CM

## 2015-05-06 LAB — CBC WITH DIFFERENTIAL/PLATELET
Basophils Absolute: 0 10*3/uL (ref 0.0–0.1)
Basophils Relative: 1 % (ref 0–1)
EOS ABS: 0.4 10*3/uL (ref 0.0–0.7)
EOS PCT: 8 % — AB (ref 0–5)
HEMATOCRIT: 46.2 % — AB (ref 36.0–46.0)
HEMOGLOBIN: 15.6 g/dL — AB (ref 12.0–15.0)
Lymphocytes Relative: 35 % (ref 12–46)
Lymphs Abs: 1.7 10*3/uL (ref 0.7–4.0)
MCH: 34.5 pg — AB (ref 26.0–34.0)
MCHC: 33.8 g/dL (ref 30.0–36.0)
MCV: 102.2 fL — ABNORMAL HIGH (ref 78.0–100.0)
MONOS PCT: 10 % (ref 3–12)
Monocytes Absolute: 0.5 10*3/uL (ref 0.1–1.0)
NEUTROS PCT: 47 % (ref 43–77)
Neutro Abs: 2.3 10*3/uL (ref 1.7–7.7)
Platelets: 58 10*3/uL — ABNORMAL LOW (ref 150–400)
RBC: 4.52 MIL/uL (ref 3.87–5.11)
RDW: 14.7 % (ref 11.5–15.5)
WBC: 4.8 10*3/uL (ref 4.0–10.5)

## 2015-05-06 LAB — FERRITIN: Ferritin: 105 ng/mL (ref 11–307)

## 2015-05-06 NOTE — Progress Notes (Signed)
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 Sydell Axon  CURRENT THERAPY: Observation  INTERVAL HISTORY: Dawn Beard 59 y.o. female returns for follow-up of her thrombocytopenia felt to be secondary to cirrhosis/splenomegaly. She denies any bleeding or bruising.  Her appetite is good.   She is here with a translator today. She does not speak [place] and a translator was present during our visit. She feels tired often. She asks why she feels this way and how to make it better. She sometimes shakes and feels weak. She experiences dizziness. She burnt her upper right arm recently while cooking. She has not been putting antibiotic ointment on it.   She brought a pain medication she received in [place] and wonders if she can continue taking them.  She was seen by GI recently due to her liver disease.   MEDICAL HISTORY: Past Medical History  Diagnosis Date  . Arthritis   . Stroke     slight    MEDICAL HISTORY: Past Medical History  Diagnosis Date  . Arthritis   . Stroke     slight  . Cirrhosis     likely NASH  . History of TIAs     has Fever; Thrombocytopenia; Sepsis; Headache; Morbid obesity; Acute pyelonephritis; Cirrhosis; Breast mass, left; Hepatic cirrhosis; Encounter for screening colonoscopy; Encephalopathy, hepatic; Pain in the chest; and Chest pain on her problem list.     is allergic to shrimp.  Dawn Beard does not currently have medications on file.  SURGICAL HISTORY: Past Surgical History  Procedure Laterality Date  . Appendectomy    . External ear surgery    . Abdominal hysterectomy    . Colonoscopy N/A 07/04/2014    Dr. Vivi Ferns  . Esophagogastroduodenoscopy N/A 07/04/2014    Dr. Rourk:normal esophagus, query portal gastropathy, chronic atrophic gastritis with intestinal metaplasia, screening  EGD in 2-3 years    SOCIAL HISTORY: History   Social History  . Marital Status: Married    Spouse Name: N/A  . Number of Children: N/A  . Years of Education: N/A   Occupational History  . Not on file.   Social History Main Topics  . Smoking status: Never Smoker   . Smokeless tobacco: Never Used     Comment: Never smoked  . Alcohol Use: No  . Drug Use: No  . Sexual Activity: Not on file   Other Topics Concern  . Not on file   Social History Narrative    FAMILY HISTORY: Family History  Problem Relation Age of Onset  . Liver cancer Mother     ? pancreatic cancer  . Colon cancer Neg Hx     Review of Systems  Constitutional: Negative.   HENT: Negative.  Eyes: Negative.   Respiratory: Negative.   Cardiovascular: Negative.   Gastrointestinal: Negative.   Genitourinary: Negative.   Musculoskeletal: Negative.   Skin: Positive for burn.       Circular burn on right upper arm. Neurological: Positive for dizziness. Endo/Heme/Allergies: Negative.   Psychiatric/Behavioral: Negative.   14 point review of systems was performed and is negative except as detailed under history of present illness and above   PHYSICAL EXAMINATION  ECOG PERFORMANCE STATUS: 0 - Asymptomatic  Filed Vitals:   05/06/15 1100  BP: 135/71  Pulse: 84  Temp: 98 F (36.7 C)  Resp: 20    Physical Exam  Constitutional: She is oriented to person,  place, and time and well-developed, well-nourished, and in no distress.  obese  HENT:  Head: Normocephalic and atraumatic.  Nose: Nose normal.  Mouth/Throat: Oropharynx is clear and moist. No oropharyngeal exudate.  Eyes: Conjunctivae and EOM are normal. Pupils are equal, round, and reactive to light. Right eye exhibits no discharge. Left eye exhibits no discharge. No scleral icterus.  Neck: Normal range of motion. Neck supple. No tracheal deviation present. No thyromegaly present.  Cardiovascular: Normal rate, regular rhythm and normal heart  sounds.  Exam reveals no gallop and no friction rub.  No murmur heard. Pulmonary/Chest: Effort normal and breath sounds normal. She has no wheezes. She has no rales.  Abdominal: Soft. Bowel sounds are normal. She exhibits no distension and no mass. There is no tenderness. There is no rebound and no guarding.  Musculoskeletal: Normal range of motion. She exhibits no edema.  Lymphadenopathy:    She has no cervical adenopathy.  Neurological: She is alert and oriented to person, place, and time. She has normal reflexes. No cranial nerve deficit. Gait normal. Coordination normal.  Skin: Skin is warm and dry. No rash noted.     Right upper arm large circular burn, pink-red in color. No infection, approximately 4 cm in largest dimension. Psychiatric: Mood, memory, affect and judgment normal.  Nursing note and vitals reviewed.   LABORATORY DATA:  CBC    Component Value Date/Time   WBC 4.8 05/06/2015 1101   RBC 4.52 05/06/2015 1101   HGB 15.6* 05/06/2015 1101   HGB 14.7 04/30/2014   HCT 46.2* 05/06/2015 1101   PLT 58* 05/06/2015 1101   MCV 102.2* 05/06/2015 1101   MCH 34.5* 05/06/2015 1101   MCHC 33.8 05/06/2015 1101   RDW 14.7 05/06/2015 1101   LYMPHSABS 1.7 05/06/2015 1101   MONOABS 0.5 05/06/2015 1101   EOSABS 0.4 05/06/2015 1101   BASOSABS 0.0 05/06/2015 1101   CMP     Component Value Date/Time   NA 139 11/25/2014 0504   K 4.2 11/25/2014 0504   CL 113* 11/25/2014 0504   CO2 21 11/25/2014 0504   GLUCOSE 71 11/25/2014 0504   BUN 13 11/25/2014 0504   CREATININE 0.48* 11/25/2014 0504   CALCIUM 9.5 11/25/2014 0504   PROT 6.2 11/25/2014 0504   ALBUMIN 2.6* 11/25/2014 0504   AST 63* 11/25/2014 0504   AST 49 04/30/2014   ALT 33 11/25/2014 0504   ALT 23 04/30/2014   ALKPHOS 141* 11/25/2014 0504   ALKPHOS 132 04/30/2014   BILITOT 1.4* 11/25/2014 0504   BILITOT 1.2 04/30/2014   GFRNONAA >90 11/25/2014 0504   GFRAA >90 11/25/2014 0504     ASSESSMENT and THERAPY PLAN:    Thrombocytopenia Cirrhosis secondary to NASH Splenomegaly secondary to above EGD 07/04/2014 without varices, Dr. Sydell Axon R breast mass  Her blood counts are stable and I have recommended ongoing observation.  She is to follow with GI as scheduled.  I again reviewed with the patient the cause of her low platelets. Her macrocytosis is also likely secondary to her liver disease.  I will order her repeat diagnostic mammogram for the end of September. She also needs an ultrasound.   We discussed that dietary changes could help her fatty liver. We again addressed her weight and its contribution to her fatigue.  I looked at the pain medication, Naproxen, she brought from [place]. I advised that it may increase the likelihood of bleeding given her platelet count. I would not recommend regular use. She can take intermittently. She  will return in several months for ongoing observation.    She was advised to notify us for increased bleeding or bruising. Orders Placed This Encounter  Procedures  . MM Digital Diagnostic Unilat R    Standing Status: Future     Number of Occurrences:      Standing Expiration Date: 05/05/2016    Order Specific Question:  Reason for Exam (SYMPTOM  OR DIAGNOSIS REQUIRED)    Answer:  R breast mass    Order Specific Question:  Is the patient pregnant?    Answer:  No    Order Specific Question:  Preferred imaging location?    Answer:  [facility]  . US Breast Complete [place]    Standing Status: Future     Number of Occurrences:      Standing Expiration Date: 07/05/2016    Order Specific Question:  Reason for Exam (SYMPTOM  OR DIAGNOSIS REQUIRED)    Answer:  R breast mass    Order Specific Question:  Preferred imaging location?    Answer:  [facility]  . CBC with Differential    Standing Status: Future     Number of Occurrences:      Standing Expiration Date: 05/05/2016  . Comprehensive metabolic panel    Standing Status: Future     Number  of Occurrences:      Standing Expiration Date: 05/05/2016   All questions were answered. The patient knows to call the clinic with any problems, questions or concerns. We can certainly see the patient much sooner if necessary.   This document serves as a record of services personally performed by Ancil Linsey, MD. It was created on her behalf by Arlyce Harman, a trained medical scribe. The creation of this record is based on the scribe's personal observations and the provider's statements to them. This document has been checked and approved by the attending provider.  I have reviewed the above documentation for accuracy and completeness, and I agree with the above.  Molli Hazard, MD  05/06/2015

## 2015-05-06 NOTE — Progress Notes (Signed)
Labs drawn

## 2015-05-06 NOTE — Patient Instructions (Signed)
[place] at [facility] Discharge Instructions  RECOMMENDATIONS MADE BY THE CONSULTANT AND ANY TEST RESULTS WILL BE SENT TO YOUR REFERRING PHYSICIAN.  Exam completed by Dr Whitney Muse today. Use triple antibiotic ointment on burn twice a day. May cover if you want.   Going to have mammogram scheduled. Return in to see the doctor in 6 months. Please call the clinic if you have any questions or concerns.  Thank you for choosing [place] at [facility] to provide your oncology and hematology care.  To afford each patient quality time with our provider, please arrive at least 15 minutes before your scheduled appointment time.    You need to re-schedule your appointment should you arrive 10 or more minutes late.  We strive to give you quality time with our providers, and arriving late affects you and other patients whose appointments are after yours.  Also, if you no show three or more times for appointments you may be dismissed from the clinic at the providers discretion.     Again, thank you for choosing [facility].  Our hope is that these requests will decrease the amount of time that you wait before being seen by our physicians.       _____________________________________________________________  Should you have questions after your visit to [facility], please contact our office at [phone] between the hours of 8:30 a.m. and 4:30 p.m.  Voicemails left after 4:30 p.m. will not be returned until the following business day.  For prescription refill requests, have your pharmacy contact our office.

## 2015-05-14 ENCOUNTER — Ambulatory Visit (HOSPITAL_COMMUNITY)
Admission: RE | Admit: 2015-05-14 | Discharge: 2015-05-14 | Disposition: A | Discharge status: Home / Self Care | Payer: PRIVATE HEALTH INSURANCE | Source: Ambulatory Visit | Attending: Hematology & Oncology | Admitting: Hematology & Oncology

## 2015-05-14 DIAGNOSIS — N6310 Unspecified lump in the right breast, unspecified quadrant: Secondary | ICD-10-CM

## 2015-05-14 DIAGNOSIS — D696 Thrombocytopenia, unspecified: Secondary | ICD-10-CM

## 2015-06-25 ENCOUNTER — Other Ambulatory Visit (HOSPITAL_COMMUNITY): Payer: Self-pay | Admitting: Hematology & Oncology

## 2015-06-25 ENCOUNTER — Ambulatory Visit (HOSPITAL_COMMUNITY)
Admission: RE | Admit: 2015-06-25 | Discharge: 2015-06-25 | Disposition: A | Discharge status: Home / Self Care | Payer: PRIVATE HEALTH INSURANCE | Source: Ambulatory Visit | Attending: Hematology & Oncology | Admitting: Hematology & Oncology

## 2015-06-25 DIAGNOSIS — N63 Unspecified lump in breast: Secondary | ICD-10-CM | POA: Diagnosis not present

## 2015-06-25 DIAGNOSIS — D696 Thrombocytopenia, unspecified: Secondary | ICD-10-CM

## 2015-06-25 DIAGNOSIS — N6310 Unspecified lump in the right breast, unspecified quadrant: Secondary | ICD-10-CM

## 2015-06-27 ENCOUNTER — Other Ambulatory Visit (HOSPITAL_COMMUNITY): Payer: Self-pay | Admitting: Hematology & Oncology

## 2015-06-27 DIAGNOSIS — N63 Unspecified lump in unspecified breast: Secondary | ICD-10-CM

## 2015-06-27 DIAGNOSIS — Z09 Encounter for follow-up examination after completed treatment for conditions other than malignant neoplasm: Secondary | ICD-10-CM

## 2015-07-15 ENCOUNTER — Encounter: Payer: Self-pay | Admitting: Internal Medicine

## 2015-07-26 ENCOUNTER — Encounter (HOSPITAL_COMMUNITY): Payer: Self-pay | Admitting: *Deleted

## 2015-07-26 ENCOUNTER — Emergency Department (HOSPITAL_COMMUNITY): Payer: BLUE CROSS/BLUE SHIELD

## 2015-07-26 ENCOUNTER — Emergency Department (HOSPITAL_COMMUNITY)
Admission: EM | Admit: 2015-07-26 | Discharge: 2015-07-26 | Disposition: A | Discharge status: Home / Self Care | Payer: BLUE CROSS/BLUE SHIELD | Attending: Emergency Medicine | Admitting: Emergency Medicine

## 2015-07-26 DIAGNOSIS — M1990 Unspecified osteoarthritis, unspecified site: Secondary | ICD-10-CM | POA: Diagnosis not present

## 2015-07-26 DIAGNOSIS — Z8673 Personal history of transient ischemic attack (TIA), and cerebral infarction without residual deficits: Secondary | ICD-10-CM | POA: Insufficient documentation

## 2015-07-26 DIAGNOSIS — K2970 Gastritis, unspecified, without bleeding: Secondary | ICD-10-CM

## 2015-07-26 DIAGNOSIS — Z791 Long term (current) use of non-steroidal anti-inflammatories (NSAID): Secondary | ICD-10-CM | POA: Insufficient documentation

## 2015-07-26 DIAGNOSIS — M549 Dorsalgia, unspecified: Secondary | ICD-10-CM | POA: Insufficient documentation

## 2015-07-26 DIAGNOSIS — R1013 Epigastric pain: Secondary | ICD-10-CM

## 2015-07-26 LAB — COMPREHENSIVE METABOLIC PANEL
ALT: 32 U/L (ref 14–54)
ANION GAP: 5 (ref 5–15)
AST: 62 U/L — ABNORMAL HIGH (ref 15–41)
Albumin: 2.8 g/dL — ABNORMAL LOW (ref 3.5–5.0)
Alkaline Phosphatase: 153 U/L — ABNORMAL HIGH (ref 38–126)
BUN: 12 mg/dL (ref 6–20)
CHLORIDE: 114 mmol/L — AB (ref 101–111)
CO2: 23 mmol/L (ref 22–32)
Calcium: 10 mg/dL (ref 8.9–10.3)
Creatinine, Ser: 0.56 mg/dL (ref 0.44–1.00)
Glucose, Bld: 122 mg/dL — ABNORMAL HIGH (ref 65–99)
POTASSIUM: 4 mmol/L (ref 3.5–5.1)
Sodium: 142 mmol/L (ref 135–145)
Total Bilirubin: 1.8 mg/dL — ABNORMAL HIGH (ref 0.3–1.2)
Total Protein: 6.9 g/dL (ref 6.5–8.1)

## 2015-07-26 LAB — URINALYSIS, ROUTINE W REFLEX MICROSCOPIC
BILIRUBIN URINE: NEGATIVE
GLUCOSE, UA: NEGATIVE mg/dL
HGB URINE DIPSTICK: NEGATIVE
Ketones, ur: NEGATIVE mg/dL
Leukocytes, UA: NEGATIVE
Nitrite: NEGATIVE
PROTEIN: NEGATIVE mg/dL
Specific Gravity, Urine: >1.03 — ABNORMAL HIGH (ref 1.005–1.030)
Urobilinogen, UA: 0.2 mg/dL (ref 0.0–1.0)
pH: 5.5 (ref 5.0–8.0)

## 2015-07-26 LAB — CBC WITH DIFFERENTIAL/PLATELET
Basophils Absolute: 0 10*3/uL (ref 0.0–0.1)
Basophils Relative: 1 %
EOS PCT: 3 %
Eosinophils Absolute: 0.1 10*3/uL (ref 0.0–0.7)
HCT: 43.9 % (ref 36.0–46.0)
Hemoglobin: 14.6 g/dL (ref 12.0–15.0)
LYMPHS ABS: 0.7 10*3/uL (ref 0.7–4.0)
LYMPHS PCT: 19 %
MCH: 34.1 pg — AB (ref 26.0–34.0)
MCHC: 33.3 g/dL (ref 30.0–36.0)
MCV: 102.6 fL — AB (ref 78.0–100.0)
MONO ABS: 0.3 10*3/uL (ref 0.1–1.0)
Monocytes Relative: 8 %
Neutro Abs: 2.6 10*3/uL (ref 1.7–7.7)
Neutrophils Relative %: 70 %
PLATELETS: 52 10*3/uL — AB (ref 150–400)
RBC: 4.28 MIL/uL (ref 3.87–5.11)
RDW: 15.6 % — AB (ref 11.5–15.5)
WBC: 3.8 10*3/uL — ABNORMAL LOW (ref 4.0–10.5)

## 2015-07-26 LAB — AMMONIA: AMMONIA: 69 umol/L — AB (ref 9–35)

## 2015-07-26 LAB — LIPASE, BLOOD: LIPASE: 39 U/L (ref 11–51)

## 2015-07-26 MED ORDER — OXYCODONE HCL 5 MG PO TABS: 5.0000 mg | ORAL_TABLET | ORAL | Status: DC | PRN

## 2015-07-26 MED ORDER — IOHEXOL 300 MG/ML  SOLN
100.0000 mL | Freq: Once | INTRAMUSCULAR | Status: AC | PRN
  Administered 2015-07-26: 100 mL via INTRAVENOUS

## 2015-07-26 MED ORDER — OMEPRAZOLE 20 MG PO CPDR: 20.0000 mg | DELAYED_RELEASE_CAPSULE | Freq: Two times a day (BID) | ORAL | Status: DC

## 2015-07-26 MED ORDER — ONDANSETRON HCL 4 MG/2ML IJ SOLN
4.0000 mg | Freq: Once | INTRAMUSCULAR | Status: AC
  Administered 2015-07-26: 4 mg via INTRAVENOUS
  Filled 2015-07-26: qty 2

## 2015-07-26 MED ORDER — SUCRALFATE 1 G PO TABS: 1.0000 g | ORAL_TABLET | Freq: Four times a day (QID) | ORAL | Status: DC

## 2015-07-26 MED ORDER — IOHEXOL 300 MG/ML  SOLN
25.0000 mL | Freq: Once | INTRAMUSCULAR | Status: AC | PRN
  Administered 2015-07-26: 25 mL via ORAL

## 2015-07-26 MED ORDER — MORPHINE SULFATE (PF) 4 MG/ML IV SOLN
4.0000 mg | Freq: Once | INTRAVENOUS | Status: AC
  Administered 2015-07-26: 4 mg via INTRAVENOUS
  Filled 2015-07-26: qty 1

## 2015-07-26 NOTE — Discharge Instructions (Signed)
Stop taking Mobic, until your symptoms improve. Recheck with your primary care physician if not improving in the next 7-10 days. Return to ER with any new or worsening symptoms    Dolor abdominal en adultos (Abdominal Pain, Adult) El dolor puede tener muchas causas. Normalmente la causa del dolor abdominal no es una enfermedad y Teacher, English as a foreign language sin Clinical research associate. Frecuentemente puede controlarse y tratarse en casa. Su mdico le Chartered certified accountant examen fsico y posiblemente solicite anlisis de sangre y radiografas para ayudar a Teacher, adult education la gravedad de su dolor. Sin embargo, en [employer], debe transcurrir ms tiempo antes de que se pueda Pension scheme manager una causa evidente del dolor. Antes de llegar a ese punto, es posible que su mdico no sepa si necesita ms pruebas o un tratamiento ms profundo. INSTRUCCIONES PARA EL CUIDADO EN EL HOGAR  Est atento al dolor para ver si hay cambios. Las siguientes indicaciones ayudarn a Chief Strategy Officer que pueda sentir:  [place] solo medicamentos de venta libre o recetados, segn las indicaciones del mdico.  No tome laxantes a menos que se lo haya indicado su mdico.  Pruebe con Ardelia Mems dieta lquida absoluta (caldo, t o agua) segn se lo indique su mdico. Introduzca gradualmente una dieta normal, segn su tolerancia. SOLICITE ATENCIN MDICA SI:  Tiene dolor abdominal sin explicacin.  Tiene dolor abdominal relacionado con nuseas o diarrea.  Tiene dolor cuando orina o defeca.  Experimenta dolor abdominal que lo despierta de noche.  Tiene dolor abdominal que empeora o mejora cuando come alimentos.  Tiene dolor abdominal que empeora cuando come alimentos grasosos.  Tiene fiebre. SOLICITE ATENCIN MDICA DE INMEDIATO SI:   El dolor no desaparece en un plazo mximo de 2horas.  No deja de (vomitar).  El Social research officer, government se siente solo en partes del abdomen, como el lado derecho o la parte inferior izquierda del abdomen.  Evaca materia fecal sanguinolenta o  negra, de aspecto alquitranado. ASEGRESE DE QUE:  Comprende estas instrucciones.  Controlar su afeccin.  Recibir ayuda de inmediato si no mejora o si empeora.   Esta informacin no tiene Marine scientist el consejo del mdico. Asegrese de hacerle al mdico cualquier pregunta que tenga.   Document Released: 09/14/2005 Document Revised: 10/05/2014 Elsevier Interactive Patient Education 2016 [employer].  Gastritis - Adultos  (Gastritis, Adult)  Gastrittis es la hinchazn e irritacin (inflamacin) del revestimiento interno del estmago. Si no recibe tratamiento, la gastritis puede causar sangrado y llagas.(lceras) en el estmago. CUIDADOS EN EL HOGAR   Slo tome los medicamentos segn le indique el mdico.  Si le han recetado antibiticos, tmelos segn las indicaciones. Termine de [employer], aunque comience a sentirse mejor.  Beba gran cantidad de lquido para mantener el pis (orina) de tono claro o amarillo plido.  Evite las comidas y bebidas que [employer]. Los alimentos que debe evitar son:  Otelia Limes y alcohol.  Chocolate.  Menta.  Ajo y cebolla.  Comidas muy condimentadas.  Ctricos como naranjas, limones o limas.  Alimentos que contengan tomate, como salsas, [place] y pizza.  Alimentos fritos y Radio broadcast assistant.  Haga comidas pequeas durante Psychiatrist de 3 comidas abundantes. SOLICITE AYUDA DE INMEDIATO SI:   La materia fecal (heces)es negra o de color rojo oscuro.  Vomita sangre. Puede ser similar a la borra del caf  No puede retener los lquidos.  El dolor en el vientre (abdominal) empeora.  Tiene fiebre.  No mejora luego de 1 semana.  Tiene preguntas o preocupaciones. ASEGRESE DE QUE:  Comprende estas instrucciones.  Controlar su enfermedad.  Solicitar ayuda de inmediato si no mejora o si empeora.   Esta informacin no tiene Marine scientist el consejo del mdico. Asegrese de hacerle al mdico cualquier  pregunta que tenga.   Document Released: 03/15/2012 Elsevier Interactive Patient Education [employer].

## 2015-07-26 NOTE — ED Notes (Signed)
Abdominal pain which began as right flank pain yesterday. Denies N/V/D

## 2015-07-26 NOTE — ED Provider Notes (Signed)
Care discussed with, and assumed from Dr. Wyvonnia Dusky.  On reexamination patient is improved. She states she has an appetite.  Reexamination and discharge instructions given with tele-interpreter.  No abnormality is on CT. On previous EGD to 3 years ago, patient had gastritis. Is currently tapering barbecue. I discussed this with her. We will hold her motivate for short time. Prescriptions for Prilosec, Carafate, oxycodone. I discussed the possibility of biliary disease including biliary colic. No stones noted on CT today. If she is not improving within the next 7-10 days on the above measures of asked her to recheck with her primary care physician regarding ongoing testing. Have asked her to return to emergency with any new or worsening symptoms in the interval.  Tanna Furry, MD 07/26/15 1850

## 2015-07-26 NOTE — ED Provider Notes (Signed)
CSN: [id number]     Arrival date & time 07/26/15  1317 History   First MD Initiated Contact with Patient 07/26/15 1340     Chief Complaint  Patient presents with  . Abdominal Pain     (Consider location/radiation/quality/duration/timing/severity/associated sxs/prior Treatment) HPI Comments: Level V caveat for language barrier. Patient with epigastric abdominal pain that radiates to the back that has been constant since yesterday. No nausea, vomiting or diarrhea. No fever. No urinary symptoms. Patient with a history of nonalcoholic cirrhosis but states she has never had this kind of pain before. Denies any chest pain or shortness of breath. Previous appendectomy and hysterectomy. She still has a gallbladder. No urinary or vaginal symptoms. No chest pain or shortness of breath.  The history is provided by the patient.    Past Medical History  Diagnosis Date  . Arthritis   . Stroke ([place])     slight  . Cirrhosis ([place])     likely NASH  . History of TIAs    Past Surgical History  Procedure Laterality Date  . Appendectomy    . External ear surgery    . Abdominal hysterectomy    . Colonoscopy N/A 07/04/2014    Dr. Vivi Ferns  . Esophagogastroduodenoscopy N/A 07/04/2014    Dr. Rourk:normal esophagus, query portal gastropathy, chronic atrophic gastritis with intestinal metaplasia, screening EGD in 2-3 years   Family History  Problem Relation Age of Onset  . Liver cancer Mother     ? pancreatic cancer  . Colon cancer Neg Hx    Social History  Substance Use Topics  . Smoking status: Never Smoker   . Smokeless tobacco: Never Used     Comment: Never smoked  . Alcohol Use: No   OB History    Gravida Para Term Preterm AB TAB SAB Ectopic Multiple Living   5 5 5       5      Review of Systems  Constitutional: Positive for activity change and appetite change. Negative for fever.  Respiratory: Negative for cough, chest tightness and shortness of breath.   Cardiovascular: Negative  for chest pain.  Gastrointestinal: Positive for abdominal pain. Negative for nausea, vomiting, diarrhea and constipation.  Genitourinary: Negative for vaginal bleeding, vaginal discharge and dyspareunia.  Musculoskeletal: Positive for back pain. Negative for myalgias and arthralgias.  Skin: Negative for rash.  Neurological: Negative for dizziness, weakness and headaches.  A complete 10 system review of systems was obtained and all systems are negative except as noted in the HPI and PMH.      Allergies  Shrimp  Home Medications   Prior to Admission medications   Medication Sig Start Date End Date Taking? Authorizing Provider  lactulose (CHRONULAC) 10 GM/15ML solution Take 30 to 45 ml 3 to 4 times a day to have 2 to 3 soft bowel movements a day. 02/27/15  Yes Orvil Feil, NP  meloxicam (MOBIC) 15 MG tablet Take 15 mg by mouth daily.   Yes Historical Provider, MD  rifaximin (XIFAXAN) 550 MG TABS tablet Take 1 tablet (550 mg total) by mouth 2 (two) times daily. 02/27/15  Yes Orvil Feil, NP  traMADol (ULTRAM) 50 MG tablet Take 50 mg by mouth at bedtime.    Yes Historical Provider, MD   BP 121/68 mmHg  Pulse 67  Temp(Src) 97.4 F (36.3 C) (Oral)  Resp 20  Ht 5\' 3"  (1.6 m)  Wt 221 lb (100.245 kg)  BMI 39.16 kg/m2  SpO2 99% Physical Exam  Constitutional: She is oriented to person, place, and time. She appears well-developed and well-nourished. No distress.  HENT:  Head: Normocephalic and atraumatic.  Mouth/Throat: Oropharynx is clear and moist. No oropharyngeal exudate.  Eyes: Conjunctivae and EOM are normal. Pupils are equal, round, and reactive to light.  Neck: Normal range of motion. Neck supple.  No meningismus.  Cardiovascular: Normal rate, regular rhythm, normal heart sounds and intact distal pulses.   No murmur heard. Pulmonary/Chest: Effort normal and breath sounds normal. No respiratory distress.  Abdominal: Soft. There is tenderness. There is no rebound and no guarding.   TTP epigastric and RUQ, no guarding or rebound  Musculoskeletal: Normal range of motion. She exhibits no edema or tenderness.  Neurological: She is alert and oriented to person, place, and time. No cranial nerve deficit. She exhibits normal muscle tone. Coordination normal.  No ataxia on finger to nose bilaterally. No pronator drift. 5/5 strength throughout. CN 2-12 intact. Negative Romberg. Equal grip strength. Sensation intact. Gait is normal.   Skin: Skin is warm.  Psychiatric: She has a normal mood and affect. Her behavior is normal.  Nursing note and vitals reviewed.   ED Course  Procedures (including critical care time) Labs Review Labs Reviewed  URINALYSIS, ROUTINE W REFLEX MICROSCOPIC (NOT AT [facility]) - Abnormal; Notable for the following:    Specific Gravity, Urine >1.030 (*)    All other components within normal limits  CBC WITH DIFFERENTIAL/PLATELET - Abnormal; Notable for the following:    WBC 3.8 (*)    MCV 102.6 (*)    MCH 34.1 (*)    RDW 15.6 (*)    Platelets 52 (*)    All other components within normal limits  COMPREHENSIVE METABOLIC PANEL - Abnormal; Notable for the following:    Chloride 114 (*)    Glucose, Bld 122 (*)    Albumin 2.8 (*)    AST 62 (*)    Alkaline Phosphatase 153 (*)    Total Bilirubin 1.8 (*)    All other components within normal limits  AMMONIA - Abnormal; Notable for the following:    Ammonia 69 (*)    All other components within normal limits  LIPASE, BLOOD    Imaging Review No results found. I have personally reviewed and evaluated these images and lab results as part of my medical decision-making.   EKG Interpretation   Date/Time:  Friday July 26 2015 15:00:21 EDT Ventricular Rate:  64 PR Interval:  151 QRS Duration: 100 QT Interval:  424 QTC Calculation: 437 R Axis:   26 Text Interpretation:  Sinus rhythm No significant change was found  Confirmed by Wyvonnia Dusky  MD, Laurin Paulo [phone]) on 07/26/2015 4:24:48 PM      MDM    Final diagnoses:  None   patient with epigastric pain radiated into the back since last night. It is constant. No nausea or vomiting. No fever. Pain is different than her usual pain from cirrhosis.  Concern for possible gallbladder or pancreas pathology. Labs show mild leukopenia. LFTs are baseline. Lipase is normal.  EGD in 2015 showed gastritis and portal gastropathy, no varices CT scan will be obtained to evaluate source of patient's pain. Care transferred to Dr. Jeneen Rinks at shift change.  Ezequiel Essex, MD 07/26/15 [phone]

## 2015-09-09 ENCOUNTER — Other Ambulatory Visit (HOSPITAL_COMMUNITY): Payer: Self-pay | Admitting: Family Medicine

## 2015-09-09 DIAGNOSIS — R1013 Epigastric pain: Secondary | ICD-10-CM

## 2015-09-09 DIAGNOSIS — K7460 Unspecified cirrhosis of liver: Secondary | ICD-10-CM

## 2015-09-11 ENCOUNTER — Encounter (HOSPITAL_COMMUNITY): Payer: BLUE CROSS/BLUE SHIELD

## 2015-11-06 ENCOUNTER — Other Ambulatory Visit (HOSPITAL_COMMUNITY): Payer: PRIVATE HEALTH INSURANCE

## 2015-11-06 ENCOUNTER — Ambulatory Visit (HOSPITAL_COMMUNITY): Payer: PRIVATE HEALTH INSURANCE | Admitting: Hematology & Oncology

## 2015-11-06 ENCOUNTER — Encounter (HOSPITAL_COMMUNITY): Payer: BLUE CROSS/BLUE SHIELD | Admitting: Oncology

## 2015-11-13 ENCOUNTER — Encounter (HOSPITAL_COMMUNITY): Payer: Self-pay | Admitting: Oncology

## 2015-11-13 ENCOUNTER — Ambulatory Visit (HOSPITAL_COMMUNITY): Payer: BLUE CROSS/BLUE SHIELD | Admitting: Oncology

## 2015-11-13 ENCOUNTER — Other Ambulatory Visit (HOSPITAL_COMMUNITY): Payer: BLUE CROSS/BLUE SHIELD

## 2015-11-13 DIAGNOSIS — R161 Splenomegaly, not elsewhere classified: Secondary | ICD-10-CM

## 2015-11-13 HISTORY — DX: Splenomegaly, not elsewhere classified: R16.1

## 2015-11-13 NOTE — Progress Notes (Signed)
NO SHOW

## 2015-11-13 NOTE — Assessment & Plan Note (Deleted)
Thrombocytopenia secondary to cirrhosis with NASH and splenomegaly.  Of note, EGD by Dr. Gala Romney on 07/04/2014 noted varices. STABLE.  Labs today: CBC diff, CMET.  Labs in 6 months: CBC diff, CMET  Return in 6 months for follow-up.

## 2015-11-13 NOTE — Assessment & Plan Note (Deleted)
Repeat diagnostic mammogram is scheduled for 3/28.  She is reminded of this.  Will see her back sooner based upon the results of this imaging.

## 2015-12-19 ENCOUNTER — Other Ambulatory Visit (HOSPITAL_COMMUNITY): Payer: Self-pay | Admitting: Hematology & Oncology

## 2015-12-19 DIAGNOSIS — N63 Unspecified lump in unspecified breast: Secondary | ICD-10-CM

## 2015-12-19 DIAGNOSIS — Z09 Encounter for follow-up examination after completed treatment for conditions other than malignant neoplasm: Secondary | ICD-10-CM

## 2015-12-23 ENCOUNTER — Encounter (HOSPITAL_COMMUNITY): Payer: Self-pay | Admitting: Emergency Medicine

## 2015-12-23 ENCOUNTER — Emergency Department (HOSPITAL_COMMUNITY)
Admission: EM | Admit: 2015-12-23 | Discharge: 2015-12-24 | Disposition: A | Discharge status: Home / Self Care | Payer: BLUE CROSS/BLUE SHIELD | Attending: Emergency Medicine | Admitting: Emergency Medicine

## 2015-12-23 DIAGNOSIS — Z79899 Other long term (current) drug therapy: Secondary | ICD-10-CM | POA: Diagnosis not present

## 2015-12-23 DIAGNOSIS — M1990 Unspecified osteoarthritis, unspecified site: Secondary | ICD-10-CM | POA: Diagnosis not present

## 2015-12-23 DIAGNOSIS — I639 Cerebral infarction, unspecified: Secondary | ICD-10-CM | POA: Diagnosis not present

## 2015-12-23 DIAGNOSIS — Z791 Long term (current) use of non-steroidal anti-inflammatories (NSAID): Secondary | ICD-10-CM | POA: Insufficient documentation

## 2015-12-23 DIAGNOSIS — E663 Overweight: Secondary | ICD-10-CM | POA: Diagnosis not present

## 2015-12-23 DIAGNOSIS — R109 Unspecified abdominal pain: Secondary | ICD-10-CM | POA: Diagnosis present

## 2015-12-23 LAB — URINALYSIS, ROUTINE W REFLEX MICROSCOPIC
Bilirubin Urine: NEGATIVE
GLUCOSE, UA: NEGATIVE mg/dL
Hgb urine dipstick: NEGATIVE
Ketones, ur: NEGATIVE mg/dL
LEUKOCYTES UA: NEGATIVE
NITRITE: NEGATIVE
PH: 6 (ref 5.0–8.0)
Protein, ur: NEGATIVE mg/dL
SPECIFIC GRAVITY, URINE: 1.025 (ref 1.005–1.030)

## 2015-12-23 LAB — CBC WITH DIFFERENTIAL/PLATELET
BASOS PCT: 1 %
Basophils Absolute: 0 10*3/uL (ref 0.0–0.1)
EOS ABS: 0.4 10*3/uL (ref 0.0–0.7)
EOS PCT: 9 %
HCT: 42.3 % (ref 36.0–46.0)
HEMOGLOBIN: 14 g/dL (ref 12.0–15.0)
Lymphocytes Relative: 30 %
Lymphs Abs: 1.2 10*3/uL (ref 0.7–4.0)
MCH: 33.5 pg (ref 26.0–34.0)
MCHC: 33.1 g/dL (ref 30.0–36.0)
MCV: 101.2 fL — ABNORMAL HIGH (ref 78.0–100.0)
MONOS PCT: 14 %
Monocytes Absolute: 0.6 10*3/uL (ref 0.1–1.0)
NEUTROS PCT: 46 %
Neutro Abs: 1.8 10*3/uL (ref 1.7–7.7)
PLATELETS: 58 10*3/uL — AB (ref 150–400)
RBC: 4.18 MIL/uL (ref 3.87–5.11)
RDW: 16 % — ABNORMAL HIGH (ref 11.5–15.5)
WBC: 3.9 10*3/uL — AB (ref 4.0–10.5)

## 2015-12-23 LAB — COMPREHENSIVE METABOLIC PANEL
ALBUMIN: 2.6 g/dL — AB (ref 3.5–5.0)
ALK PHOS: 156 U/L — AB (ref 38–126)
ALT: 28 U/L (ref 14–54)
ANION GAP: 3 — AB (ref 5–15)
AST: 57 U/L — ABNORMAL HIGH (ref 15–41)
BUN: 8 mg/dL (ref 6–20)
CALCIUM: 9.9 mg/dL (ref 8.9–10.3)
CHLORIDE: 115 mmol/L — AB (ref 101–111)
CO2: 25 mmol/L (ref 22–32)
Creatinine, Ser: 0.57 mg/dL (ref 0.44–1.00)
GFR calc non Af Amer: >60 mL/min (ref >60)
GLUCOSE: 89 mg/dL (ref 65–99)
POTASSIUM: 4.5 mmol/L (ref 3.5–5.1)
SODIUM: 143 mmol/L (ref 135–145)
Total Bilirubin: 2.1 mg/dL — ABNORMAL HIGH (ref 0.3–1.2)
Total Protein: 6.6 g/dL (ref 6.5–8.1)

## 2015-12-23 NOTE — ED Notes (Signed)
PT c/o left sided flank pain with increased urinary frequency x6 days. PT states hx of same with dx of UTI.

## 2015-12-24 ENCOUNTER — Emergency Department (HOSPITAL_COMMUNITY): Payer: BLUE CROSS/BLUE SHIELD

## 2015-12-24 ENCOUNTER — Encounter (HOSPITAL_COMMUNITY): Payer: PRIVATE HEALTH INSURANCE

## 2015-12-24 MED ORDER — DIAZEPAM 5 MG PO TABS: 5.0000 mg | ORAL_TABLET | Freq: Two times a day (BID) | ORAL | Status: DC | PRN

## 2015-12-24 MED ORDER — IBUPROFEN 400 MG PO TABS: 400.0000 mg | ORAL_TABLET | Freq: Three times a day (TID) | ORAL | Status: DC | PRN

## 2015-12-24 MED ORDER — DIAZEPAM 5 MG PO TABS
5.0000 mg | ORAL_TABLET | Freq: Once | ORAL | Status: AC
Start: 2015-12-24 — End: 2015-12-24
  Administered 2015-12-24: 5 mg via ORAL
  Filled 2015-12-24: qty 1

## 2015-12-24 NOTE — Discharge Instructions (Signed)
Dolor en el flanco  (Flank Pain) El dolor en el flanco se refiere a aquel dolor que se localiza en un lado del cuerpo, entre la zona superior del abdomen y Counsellor. Puede aparecer en un perodo corto de tiempo (agudo) o puede durar mucho tiempo o ser recurrente (crnico). Puede ser leve o intenso. Puede tener diferentes causas.  CAUSAS  Algunas de las causas ms comunes son:   Esguince muscular.   Espasmos musculares.   Problemas en la columna vertebral (enfermedad de los discos).   Infeccin en el pulmn (neumona).   Lquido en los pulmones (edema pulmonar).   Infeccin renal.   Piedras en el rin.   Una erupcin cutnea muy dolorosa causada por el virus de la varicela (herpes zoster).  Enfermedad de la vescula biliar. INSTRUCCIONES PARA EL CUIDADO EN EL HOGAR  Los cuidados en el hogar dependern de la causa del dolor. En general:   Haga reposo segn las indicaciones del mdico.  Debe ingerir gran cantidad de lquido para mantener la orina de tono claro o color amarillo plido.  Tome slo medicamentos de venta libre o recetados, segn las indicaciones del mdico. Algunos medicamentos ayudan a Best boy.  Informe al mdico si tiene algn Risk manager.  Concurra a las visitas de control, segn las indicaciones. SOLICITE ATENCIN MDICA DE INMEDIATO SI:   El dolor no se alivia con los [employer].   Tiene sntomas nuevos o que empeoran.  El dolor [place].   Siente dolor abdominal.   Le falta el aire.   Tiene nuseas o vmitos persistentes.   El abdomen se hincha.   Sufre mareos o se desmaya.   Observa sangre en la orina.  Tiene fiebre o sntomas persistentes durante ms de 2  3 das.  Tiene fiebre y los sntomas empeoran repentinamente. ASEGRESE DE QUE:   Comprende estas instrucciones.  Controlar su enfermedad.  Solicitar ayuda de inmediato si no mejora o si empeora.   Esta informacin no tiene Marine scientist  el consejo del mdico. Asegrese de hacerle al mdico cualquier pregunta que tenga.   Document Released: 12/22/2007 Document Revised: 06/08/2012 Elsevier Interactive Patient Education [employer].

## 2015-12-24 NOTE — ED Provider Notes (Signed)
CSN: [id number]     Arrival date & time 12/23/15  1607 History   First MD Initiated Contact with Patient 12/23/15 2341     Chief Complaint  Patient presents with  . Flank Pain     (Consider location/radiation/quality/duration/timing/severity/associated sxs/prior Treatment) HPI  This is a 60 year old female with history of cirrhosis who presents with left flank pain. Patient reports 6 day history of pain. It is worse with movement. Pain is achy.  Currently it is 8 out of 10. She also reports urinary frequency. History of pyelonephritis. Denies fevers, vomiting, diarrhea, or abdominal pain.  Patient recently traveled from [place] and has done "a lot of sitting." Denies chest pain, shortness of breath.  Past Medical History  Diagnosis Date  . Arthritis   . Stroke ([place])     slight  . 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 History of TIAs   . Splenomegaly 11/13/2015   Past Surgical History  Procedure Laterality Date  . Appendectomy    . External ear surgery    . Abdominal hysterectomy    . Colonoscopy N/A 07/04/2014    Dr. Vivi Ferns  . Esophagogastroduodenoscopy N/A 07/04/2014    Dr. Rourk:normal esophagus, query portal gastropathy, chronic atrophic gastritis with intestinal metaplasia, screening EGD in 2-3 years   Family History  Problem Relation Age of Onset  . Liver cancer Mother     ? pancreatic cancer  . Colon cancer Neg Hx    Social History  Substance Use Topics  . Smoking status: Never Smoker   . Smokeless tobacco: Never Used     Comment: Never smoked  . Alcohol Use: No   OB History    Gravida Para Term Preterm AB TAB SAB Ectopic Multiple Living   5 5 5       5      Review of Systems  Constitutional: Negative for fever.  Respiratory: Negative for cough and shortness of breath.   Cardiovascular: Negative for chest pain.  Gastrointestinal: Negative for nausea, vomiting, abdominal pain and diarrhea.  Genitourinary: Positive for frequency and flank pain. Negative for  dysuria and hematuria.  All other systems reviewed and are negative.     Allergies  Shrimp  Home Medications   Prior to Admission medications   Medication Sig Start Date End Date Taking? Authorizing Provider  diazepam (VALIUM) 5 MG tablet Take 1 tablet (5 mg total) by mouth every 12 (twelve) hours as needed for muscle spasms. 12/24/15   Merryl Hacker, MD  ibuprofen (ADVIL,MOTRIN) 400 MG tablet Take 1 tablet (400 mg total) by mouth every 8 (eight) hours as needed. 12/24/15   Merryl Hacker, MD  lactulose (CHRONULAC) 10 GM/15ML solution Take 30 to 45 ml 3 to 4 times a day to have 2 to 3 soft bowel movements a day. 02/27/15   Orvil Feil, NP  meloxicam (MOBIC) 15 MG tablet Take 15 mg by mouth daily.    Historical Provider, MD  omeprazole (PRILOSEC) 20 MG capsule Take 1 capsule (20 mg total) by mouth 2 (two) times daily. 07/26/15   Tanna Furry, MD  oxyCODONE (ROXICODONE) 5 MG immediate release tablet Take 1 tablet (5 mg total) by mouth every 4 (four) hours as needed for severe pain. 07/26/15   Tanna Furry, MD  rifaximin (XIFAXAN) 550 MG TABS tablet Take 1 tablet (550 mg total) by mouth 2 (two) times daily. 02/27/15   Orvil Feil, NP  sucralfate (CARAFATE) 1 G tablet Take 1 tablet (1  g total) by mouth 4 (four) times daily. 07/26/15   Tanna Furry, MD  traMADol (ULTRAM) 50 MG tablet Take 50 mg by mouth at bedtime.     Historical Provider, MD   BP 112/69 mmHg  Pulse 68  Temp(Src) 98.7 F (37.1 C) (Oral)  Resp 18  Ht 5\' 4"  (1.626 m)  Wt 220 lb (99.791 kg)  BMI 37.74 kg/m2  SpO2 100% Physical Exam  Constitutional: She is oriented to person, place, and time. No distress.  Overweight  HENT:  Head: Normocephalic and atraumatic.  Cardiovascular: Normal rate, regular rhythm and normal heart sounds.   No murmur heard. Pulmonary/Chest: Effort normal and breath sounds normal. No respiratory distress. She has no wheezes.  Abdominal: Soft. Bowel sounds are normal. There is no tenderness. There is  no rebound.  Musculoskeletal:  Tenderness to palpation left paraspinous muscle region, spasm noted, no midline tenderness to palpation  Neurological: She is alert and oriented to person, place, and time.  5 out of 5 strength bilateral lower extremities, normal pulses  Skin: Skin is warm and dry.  Psychiatric: She has a normal mood and affect.  Nursing note and vitals reviewed.   ED Course  Procedures (including critical care time) Labs Review Labs Reviewed  CBC WITH DIFFERENTIAL/PLATELET - Abnormal; Notable for the following:    WBC 3.9 (*)    MCV 101.2 (*)    RDW 16.0 (*)    Platelets 58 (*)    All other components within normal limits  COMPREHENSIVE METABOLIC PANEL - Abnormal; Notable for the following:    Chloride 115 (*)    Albumin 2.6 (*)    AST 57 (*)    Alkaline Phosphatase 156 (*)    Total Bilirubin 2.1 (*)    Anion gap 3 (*)    All other components within normal limits  URINE CULTURE  URINALYSIS, ROUTINE W REFLEX MICROSCOPIC (NOT AT [facility])    Imaging Review Ct Renal Stone Study  12/24/2015  CLINICAL DATA:  Acute onset of left flank pain.  Initial encounter. EXAM: CT ABDOMEN AND PELVIS WITHOUT CONTRAST TECHNIQUE: Multidetector CT imaging of the abdomen and pelvis was performed following the standard protocol without IV contrast. COMPARISON:  CT of the abdomen and pelvis from 07/26/2015, and right upper quadrant ultrasound performed 10/09/2014 FINDINGS: The visualized lung bases are clear. The nodular contour of the liver is compatible with hepatic cirrhosis. The spleen is enlarged, measuring 15.2 cm in length. The gallbladder is grossly unremarkable in appearance. The pancreas and adrenal glands are grossly unremarkable. 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 A nonobstructing 2 mm stone is noted at the lower pole of the left kidney. The kidneys are otherwise unremarkable. There is no evidence of hydronephrosis. No obstructing ureteral stones are seen. No perinephric stranding is  appreciated. No free fluid is identified. The small bowel is unremarkable in appearance. The stomach is within normal limits. No acute vascular abnormalities are seen. The patient is status post appendectomy. The colon is grossly unremarkable in appearance. The bladder is mildly distended and grossly unremarkable. The patient is status post hysterectomy. No suspicious adnexal masses are seen. No inguinal lymphadenopathy is seen. No acute osseous abnormalities are identified. Multilevel vacuum phenomenon is noted along the lumbar spine. IMPRESSION: 1. No acute abnormality seen to explain the patient's symptoms. 2. Findings of hepatic cirrhosis, with splenomegaly and enlarged splenic varices. 3. Nonobstructing 2 mm stone at the lower pole of the left kidney. 4. Mild degenerative change along the lumbar spine. Electronically  Signed   By: Garald Balding M.D.   On: 12/24/2015 01:01   I have personally reviewed and evaluated these images and lab results as part of my medical decision-making.   EKG Interpretation None      MDM   Final diagnoses:  Flank pain    Patient presents with left flank plain. Also reports urinary symptoms. Nontoxic appearing. Afebrile. Basic labwork obtained and appears at the patient's baseline. Urinalysis without obvious urinary tract infection. Urine culture pending. CT without evidence of obstructing stone. Patient does have muscle spasm and given pain with movement, suspect musculoskeletal etiology. Patient was given Valium. On repeat exam, reports improvement of pain. She was able to ambulate without difficulty. Will discharge with Valium and a short course of NSAIDs.  After history, exam, and medical workup I feel the patient has been appropriately medically screened and is safe for discharge home. Pertinent diagnoses were discussed with the patient. Patient was given return precautions.     Merryl Hacker, MD 12/24/15 [phone]

## 2015-12-24 NOTE — ED Notes (Signed)
Pt ambulated from triage to room currently in while using her cane. Tolerated well.

## 2015-12-25 LAB — URINE CULTURE

## 2016-05-11 ENCOUNTER — Other Ambulatory Visit: Payer: Self-pay | Admitting: Gastroenterology

## 2016-06-12 ENCOUNTER — Ambulatory Visit: Payer: BLUE CROSS/BLUE SHIELD | Admitting: Gastroenterology

## 2016-06-12 ENCOUNTER — Telehealth: Payer: Self-pay | Admitting: Gastroenterology

## 2016-06-12 ENCOUNTER — Encounter: Payer: Self-pay | Admitting: Gastroenterology

## 2016-06-12 NOTE — Telephone Encounter (Signed)
PT WAS A NO SHOW AND LETTER SENT  °

## 2016-07-13 ENCOUNTER — Ambulatory Visit (INDEPENDENT_AMBULATORY_CARE_PROVIDER_SITE_OTHER): Payer: BLUE CROSS/BLUE SHIELD | Admitting: Gastroenterology

## 2016-07-13 ENCOUNTER — Encounter: Payer: Self-pay | Admitting: Gastroenterology

## 2016-07-13 VITALS — BP 157/81 | HR 84 | Temp 98.3°F | Ht 66.0 in | Wt 233.8 lb

## 2016-07-13 DIAGNOSIS — R110 Nausea: Secondary | ICD-10-CM | POA: Insufficient documentation

## 2016-07-13 DIAGNOSIS — R1013 Epigastric pain: Secondary | ICD-10-CM | POA: Diagnosis not present

## 2016-07-13 DIAGNOSIS — K7460 Unspecified cirrhosis of liver: Secondary | ICD-10-CM | POA: Diagnosis not present

## 2016-07-13 LAB — CBC WITH DIFFERENTIAL/PLATELET
Basophils Absolute: 39 cells/uL (ref 0–200)
Basophils Relative: 1 %
EOS ABS: 234 {cells}/uL (ref 15–500)
Eosinophils Relative: 6 %
HEMATOCRIT: 40.4 % (ref 35.0–45.0)
HEMOGLOBIN: 13.8 g/dL (ref 11.7–15.5)
LYMPHS ABS: 1170 {cells}/uL (ref 850–3900)
Lymphocytes Relative: 30 %
MCH: 33.5 pg — ABNORMAL HIGH (ref 27.0–33.0)
MCHC: 34.2 g/dL (ref 32.0–36.0)
MCV: 98.1 fL (ref 80.0–100.0)
MONO ABS: 351 {cells}/uL (ref 200–950)
Monocytes Relative: 9 %
NEUTROS ABS: 2106 {cells}/uL (ref 1500–7800)
Neutrophils Relative %: 54 %
Platelets: 63 10*3/uL — ABNORMAL LOW (ref 140–400)
RBC: 4.12 MIL/uL (ref 3.80–5.10)
RDW: 15.1 % — ABNORMAL HIGH (ref 11.0–15.0)
WBC: 3.9 10*3/uL (ref 3.8–10.8)

## 2016-07-13 LAB — PROTIME-INR
INR: 1.4 — ABNORMAL HIGH
PROTHROMBIN TIME: 14.9 s — AB (ref 9.0–11.5)

## 2016-07-13 MED ORDER — LACTULOSE 10 GM/15ML PO SOLN: ORAL | 3 refills | Status: DC

## 2016-07-13 MED ORDER — RIFAXIMIN 550 MG PO TABS: ORAL_TABLET | ORAL | 5 refills | Status: DC

## 2016-07-13 MED ORDER — OMEPRAZOLE 20 MG PO CPDR: 20.0000 mg | DELAYED_RELEASE_CAPSULE | Freq: Two times a day (BID) | ORAL | 0 refills | Status: DC

## 2016-07-13 NOTE — Patient Instructions (Addendum)
1. Please have your labs done. You may go any time that is convenient for you. 2. Please have your ultrasound of the liver done as scheduled.  3. Once your labs and ultrasound are back, we will likely offer upper endoscopy to look at your stomach and evaluate your nausea.  4. You will need to come for an office visit [place] to follow up on your liver.  5. You need to take the follow medications. All have been sent to your pharmacy. If you are unable to get filled, please call the office.  1. Omeprazole 20mg  twice daily before a meal. This is for your stomach pain. 2. Lactulose 30-45cc 2-3 times daily. Goal of 2-3 soft stools daily, you may increase or decrease dose accordingly. Used to keep toxins (ammonia) down. 3. Xifaxan 550mg  twice daily. This is to keep the toxins down so you are more alert and less confused.    PLEASE FOLLOW UP WITH YOUR REGULAR DOCTOR FOR YOUR BACK PAIN.

## 2016-07-13 NOTE — Progress Notes (Signed)
cc'ed to pcp °

## 2016-07-13 NOTE — Assessment & Plan Note (Signed)
60 year old Hispanic female with likely Karlene Lineman cirrhosis who presents for follow-up. She has not been seen in over one year. Complains of postprandial nausea without vomiting. She is overdue for labs and hepatoma screening. Unfortunately she has come off of all of her medications. She has a history of probable portal gastropathy, chronic atrophic gastritis on EGD in 2015. She is also been on mobic recently. Complains of epigastric pain, new back pain. She reports being evaluated for possible knee replacement in the upcoming future, they're requesting her to lose an additional 10 pounds prior to that. An appointment with her surgeon this week to discuss.  Patient reports some episodes of confusion, son confirms. She has not been on her lactulose and Xifaxan. Possibly a problem at the pharmacy? PA necessary for surfactant. We will look into it.  At this time, we will update labs and abdominal ultrasound. Restart omeprazole 20 mg twice a day. Avoid MOBIC and NSAIDS for now until we figure out the etiology of her abdominal pain/nausea. She likely will require upper endoscopy for further evaluation pending labs, etc. Resume Xifaxan and lactulose as before. Further recommendations to follow.

## 2016-07-13 NOTE — Progress Notes (Signed)
Primary Care Physician:  Jana Half  Primary Gastroenterologist:  Garfield Cornea, MD   Chief Complaint  Patient presents with  . Follow-up    HPI:  Dawn Beard is a 60 y.o. female here for further evaluation of nausea, epigastric pain, evaluate for EGD. She presents with an official interpreter. Her son is present as well, he speaks/reads [place]. She was last seen in June 2016 with history of Karlene Lineman cirrhosis. Last colonoscopy October 2015 unremarkable. EGD at the same time with normal esophagus, query portal gastropathy, chronic atrophic gastritis with intestinal metaplasia, recommended screening EGD in 2-3 years. Last liver imaging October 2016 yes CT abdomen pelvis with contrast done through the ER. No suspicious hepatic lesions noted.  Patient reports 10 pound weight loss. According to Epic she has gained 13 pounds since March.According to her son she was advised to lose 20 pounds that she can have a knee replacement. She's been trying to walk. She is now having some back issues. He has not discussed her back pain with her PCP. Patient states her back pain is really get her down. She has never had any issues with her back before. She denies any falls. One month ago she had urinary tract infection and was provided an antibiotic, symptoms resolved. She complains of postprandial nausea without vomiting. Does not matter what she eats. Does not matter how much she eats. Occurring for the past one month. Associated with epigastric pain. Notes the pain seems to radiate from her back to the epigastrium. She reports normal bowel movements, no blood in the stool or melena.  Previously on Mobic but she ran out. She is not taking any medications regularly. She did not know that she needed to get refills on her lactulose, Xifaxan, and omeprazole. Son believes it was an issue getting the Xifaxan filled at the pharmacy. Patient complains of some episodes of confusion. Complains of bruising.  Denies hematuria, dysuria.       Current Outpatient Prescriptions  Medication Sig Dispense Refill  . lactulose (CHRONULAC) 10 GM/15ML solution Take 30 to 45 ml 3 to 4 times a day to have 2 to 3 soft bowel movements a day. (Patient not taking: Reported on 07/13/2016) 1892 mL 3  . omeprazole (PRILOSEC) 20 MG capsule Take 1 capsule (20 mg total) by mouth 2 (two) times daily. (Patient not taking: Reported on 07/13/2016) 60 capsule 0  . XIFAXAN 550 MG TABS tablet TAKE 1 TABLET(550 MG) BY MOUTH TWICE DAILY (Patient not taking: Reported on 07/13/2016) 60 tablet 5   No current facility-administered medications for this visit.     Allergies as of 07/13/2016 - Review Complete 07/13/2016  Allergen Reaction Noted  . Shrimp [shellfish allergy] Itching and Rash 04/08/2014    Past Medical History:  Diagnosis Date  . Arthritis   . Cirrhosis ([place])    likely NASH  . History of TIAs   . Splenomegaly 11/13/2015  . Stroke ([place])    slight    Past Surgical History:  Procedure Laterality Date  . ABDOMINAL HYSTERECTOMY    . APPENDECTOMY    . COLONOSCOPY N/A 07/04/2014   Dr. Vivi Ferns  . ESOPHAGOGASTRODUODENOSCOPY N/A 07/04/2014   Dr. Rourk:normal esophagus, query portal gastropathy, chronic atrophic gastritis with intestinal metaplasia, screening EGD in 2-3 years  . EXTERNAL EAR SURGERY      Family History  Problem Relation Age of Onset  . Liver cancer Mother     ? pancreatic cancer  . Colon cancer Neg Hx  Social History   Social History  . Marital status: Married    Spouse name: N/A  . Number of children: N/A  . Years of education: N/A   Occupational History  . Not on file.   Social History Main Topics  . Smoking status: Never Smoker  . Smokeless tobacco: Never Used     Comment: Never smoked  . Alcohol use No  . Drug use: No  . Sexual activity: Not on file   Other Topics Concern  . Not on file   Social History Narrative  . No narrative on file       ROS:  General: Negative for anorexia, unintentional weight loss, fever, chills, fatigue, weakness. Eyes: Negative for vision changes.  ENT: Negative for hoarseness, difficulty swallowing , nasal congestion. CV: Negative for chest pain, angina, palpitations, dyspnea on exertion, + peripheral edema.  Respiratory: Negative for dyspnea at rest, dyspnea on exertion, cough, sputum, wheezing.  GI: See history of present illness. GU:  Negative for dysuria, hematuria, urinary incontinence, urinary frequency, nocturnal urination.  MS: Positive for joint pain, low back pain.  Derm: Negative for rash or itching.  Neuro: Negative for weakness, abnormal sensation, seizure, frequent headaches, memory loss, confusion.  Psych: Negative for anxiety, depression, suicidal ideation, hallucinations.  Endo: Negative for unusual weight change.  Heme: Negative for bruising or bleeding. Allergy: Negative for rash or hives.    Physical Examination:  BP (!) 157/81   Pulse 84   Temp 98.3 F (36.8 C) (Oral)   Ht 5\' 6"  (1.676 m)   Wt 233 lb 12.8 oz (106.1 kg)   BMI 37.74 kg/m    General: Well-nourished, well-developed in no acute distress.  Head: Normocephalic, atraumatic.   Eyes: Conjunctiva pink, no icterus. Mouth: Oropharyngeal mucosa moist and pink , no lesions erythema or exudate. Neck: Supple without thyromegaly, masses, or lymphadenopathy.  Lungs: Clear to auscultation bilaterally.  Heart: Regular rate and rhythm, no murmurs rubs or gallops.  Abdomen: Bowel sounds are normal, nontender, nondistended, no hepatosplenomegaly or masses, no abdominal bruits or    hernia , no rebound or guarding.   Rectal: not performed Extremities: 1+lower extremity edema. No clubbing or deformities.  Neuro: Alert and oriented x 4 , grossly normal neurologically.  Skin: Warm and dry, no rash or jaundice.   Psych: Alert and cooperative, normal mood and affect.  Labs: Lab Results  Component Value Date   CREATININE  0.57 12/23/2015   BUN 8 12/23/2015   NA 143 12/23/2015   K 4.5 12/23/2015   CL 115 (H) 12/23/2015   CO2 25 12/23/2015   Lab Results  Component Value Date   ALT 28 12/23/2015   AST 57 (H) 12/23/2015   ALKPHOS 156 (H) 12/23/2015   BILITOT 2.1 (H) 12/23/2015   Lab Results  Component Value Date   LIPASE 39 07/26/2015   Lab Results  Component Value Date   WBC 3.9 (L) 12/23/2015   HGB 14.0 12/23/2015   HCT 42.3 12/23/2015   MCV 101.2 (H) 12/23/2015   PLT 58 (L) 12/23/2015   Lab Results  Component Value Date   VITAMINB12 1,143 (H) 09/05/2014   Lab Results  Component Value Date   TIBC 281 04/30/2014   FERRITIN 105 05/06/2015     Imaging Studies: No results found.

## 2016-07-14 LAB — COMPREHENSIVE METABOLIC PANEL
ALK PHOS: 199 U/L — AB (ref 33–130)
ALT: 27 U/L (ref 6–29)
AST: 53 U/L — ABNORMAL HIGH (ref 10–35)
Albumin: 2.5 g/dL — ABNORMAL LOW (ref 3.6–5.1)
BILIRUBIN TOTAL: 2.5 mg/dL — AB (ref 0.2–1.2)
BUN: 10 mg/dL (ref 7–25)
CALCIUM: 9.3 mg/dL (ref 8.6–10.4)
CO2: 25 mmol/L (ref 20–31)
Chloride: 111 mmol/L — ABNORMAL HIGH (ref 98–110)
Creat: 0.63 mg/dL (ref 0.50–0.99)
Glucose, Bld: 83 mg/dL (ref 65–99)
POTASSIUM: 3.8 mmol/L (ref 3.5–5.3)
Sodium: 142 mmol/L (ref 135–146)
TOTAL PROTEIN: 6.1 g/dL (ref 6.1–8.1)

## 2016-07-14 LAB — LIPASE: LIPASE: 29 U/L (ref 7–60)

## 2016-07-15 ENCOUNTER — Ambulatory Visit (HOSPITAL_COMMUNITY)
Admission: RE | Admit: 2016-07-15 | Discharge: 2016-07-15 | Disposition: A | Discharge status: Home / Self Care | Payer: BLUE CROSS/BLUE SHIELD | Source: Ambulatory Visit | Attending: Gastroenterology | Admitting: Gastroenterology

## 2016-07-15 DIAGNOSIS — K7460 Unspecified cirrhosis of liver: Secondary | ICD-10-CM | POA: Insufficient documentation

## 2016-07-15 DIAGNOSIS — R110 Nausea: Secondary | ICD-10-CM | POA: Insufficient documentation

## 2016-07-15 DIAGNOSIS — R161 Splenomegaly, not elsewhere classified: Secondary | ICD-10-CM | POA: Insufficient documentation

## 2016-07-15 DIAGNOSIS — R932 Abnormal findings on diagnostic imaging of liver and biliary tract: Secondary | ICD-10-CM | POA: Diagnosis not present

## 2016-07-15 DIAGNOSIS — N200 Calculus of kidney: Secondary | ICD-10-CM | POA: Insufficient documentation

## 2016-07-15 DIAGNOSIS — R1013 Epigastric pain: Secondary | ICD-10-CM | POA: Insufficient documentation

## 2016-08-03 ENCOUNTER — Telehealth: Payer: Self-pay | Admitting: Internal Medicine

## 2016-08-03 NOTE — Progress Notes (Signed)
Please let patient or son know her LFTS are abnormal but stable. MELD 14. Low platelet count low/stable. Her u/s shows no liver masses. There is no gb disease.  Nothing to explains her abdominal pain and nausea.  Please NIC for CMET, PT/INR, CBC in six weeks.  NIC for abd u/s in six months.  Based on MELD we could start process for transplant referral but we will discuss at next OV. If still having abd pain, offer her EGD.

## 2016-08-03 NOTE — Progress Notes (Signed)
See lab result note. Needs return OV in 8 weeks.

## 2016-08-03 NOTE — Telephone Encounter (Signed)
PLEASE CALL PATIENT IF LAB RESULTS ARE READY

## 2016-08-03 NOTE — Telephone Encounter (Signed)
Routing to [facility] 

## 2016-08-03 NOTE — Telephone Encounter (Signed)
See result note.  If still having epig pain, nausea she will need EGD. Approaching 30 day deadline.

## 2016-08-07 NOTE — Telephone Encounter (Signed)
See result note, ginger spoke with the pt.

## 2016-08-10 ENCOUNTER — Other Ambulatory Visit: Payer: Self-pay | Admitting: Gastroenterology

## 2016-08-10 ENCOUNTER — Encounter: Payer: Self-pay | Admitting: Internal Medicine

## 2016-08-10 ENCOUNTER — Other Ambulatory Visit: Payer: Self-pay

## 2016-08-10 DIAGNOSIS — R109 Unspecified abdominal pain: Secondary | ICD-10-CM

## 2016-08-10 DIAGNOSIS — K7460 Unspecified cirrhosis of liver: Secondary | ICD-10-CM

## 2016-08-10 NOTE — Progress Notes (Signed)
APPT MADE AND LETTER SENT  °

## 2016-08-11 ENCOUNTER — Other Ambulatory Visit: Payer: Self-pay

## 2016-08-11 DIAGNOSIS — K7460 Unspecified cirrhosis of liver: Secondary | ICD-10-CM

## 2016-08-12 ENCOUNTER — Ambulatory Visit (HOSPITAL_COMMUNITY)
Admission: RE | Admit: 2016-08-12 | Discharge: 2016-08-12 | Disposition: A | Discharge status: Home / Self Care | Payer: BLUE CROSS/BLUE SHIELD | Source: Ambulatory Visit | Attending: Internal Medicine | Admitting: Internal Medicine

## 2016-08-12 ENCOUNTER — Encounter (HOSPITAL_COMMUNITY)
Admission: RE | Disposition: A | Discharge status: Home / Self Care | Payer: Self-pay | Source: Ambulatory Visit | Attending: Internal Medicine

## 2016-08-12 ENCOUNTER — Encounter (HOSPITAL_COMMUNITY): Payer: Self-pay | Admitting: *Deleted

## 2016-08-12 DIAGNOSIS — K3189 Other diseases of stomach and duodenum: Secondary | ICD-10-CM | POA: Diagnosis not present

## 2016-08-12 DIAGNOSIS — Z8673 Personal history of transient ischemic attack (TIA), and cerebral infarction without residual deficits: Secondary | ICD-10-CM | POA: Diagnosis not present

## 2016-08-12 DIAGNOSIS — R1013 Epigastric pain: Secondary | ICD-10-CM

## 2016-08-12 DIAGNOSIS — K2940 Chronic atrophic gastritis without bleeding: Secondary | ICD-10-CM | POA: Diagnosis not present

## 2016-08-12 DIAGNOSIS — R109 Unspecified abdominal pain: Secondary | ICD-10-CM

## 2016-08-12 HISTORY — PX: BIOPSY: SHX5522

## 2016-08-12 HISTORY — PX: ESOPHAGOGASTRODUODENOSCOPY: SHX5428

## 2016-08-12 IMAGING — CR DG CHEST 1V
1 series · 1 of 1 positions shown · non-contrast
Comparison: Chest CT 04/09/2014.  Radiographs 04/08/2014.

CLINICAL DATA: Recurrent shakes and dizziness over the last 8
months. Slurred speech and weakness for 2 months.

EXAM:
CHEST - 1 VIEW

[view not recorded]
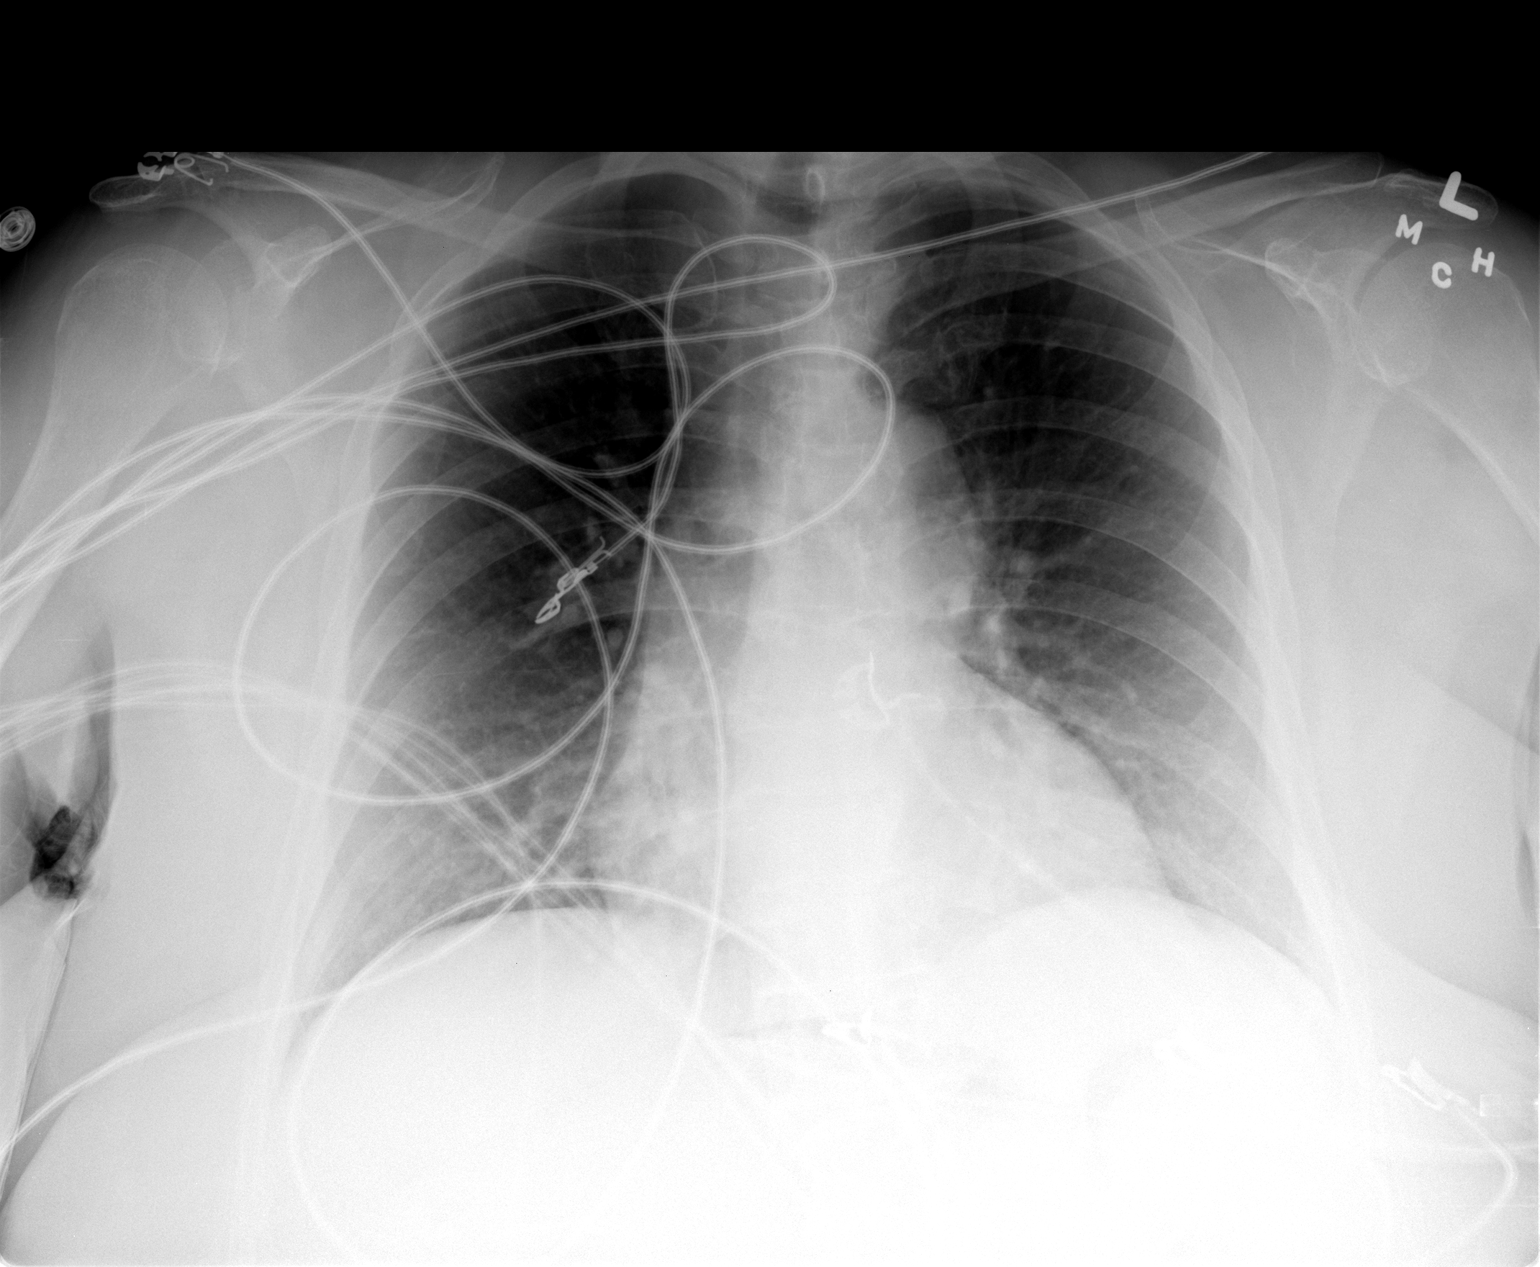

[1 of 1 positions shown; findings below may reference images not displayed]

FINDINGS: 0234 hr. The heart size and mediastinal contours are normal. The
lungs are clear. There is no pleural effusion or pneumothorax. No
acute osseous findings are identified. Telemetry leads overlie the
chest.
IMPRESSION: No active cardiopulmonary process.

## 2016-08-12 IMAGING — CT CT HEAD W/O CM
1 series · 16 of 30 positions shown, 20 images · non-contrast
Comparison: None.

CLINICAL DATA: Weakness for 3 days, shaking

EXAM:
CT HEAD WITHOUT CONTRAST
TECHNIQUE: Contiguous axial images were obtained from the base of the skull
through the vertex without intravenous contrast.

[Series 2: headseq 4.8 h37s · axial · 0.43mm/px · z∈[+110,+262]mm · 16 of 36 slices shown, 20 images]
[im 2/36  brain]
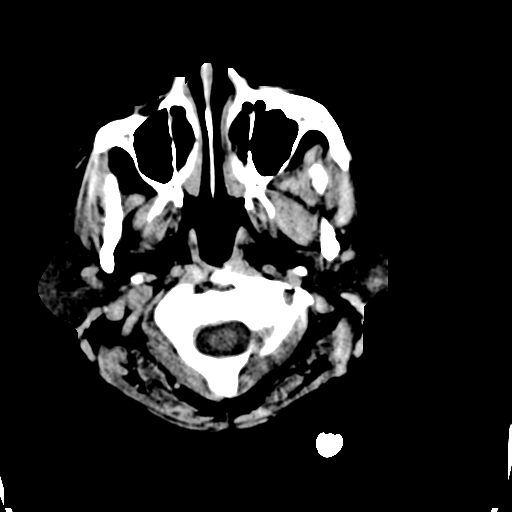
[im 2/36  bone]
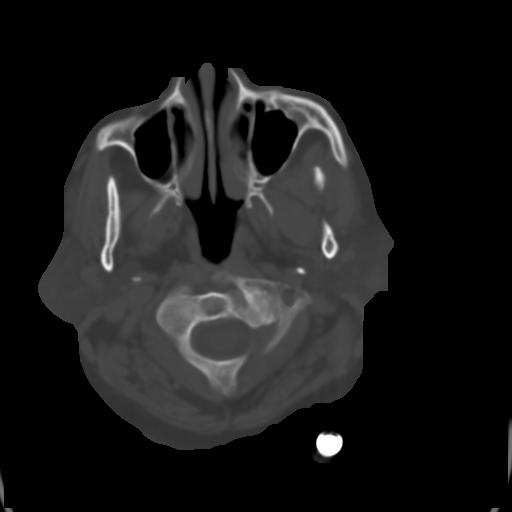
[im 4/36  brain]
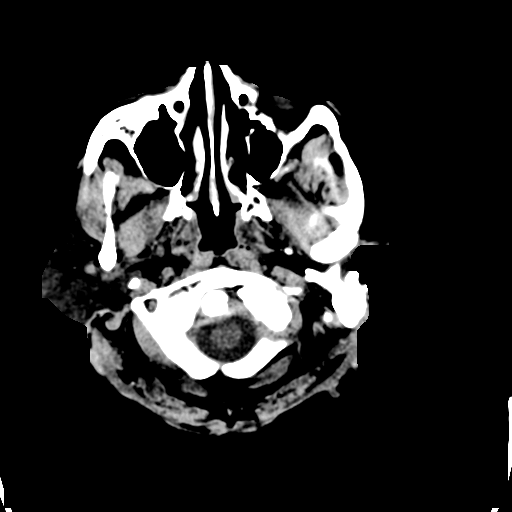
[im 7/36  brain]
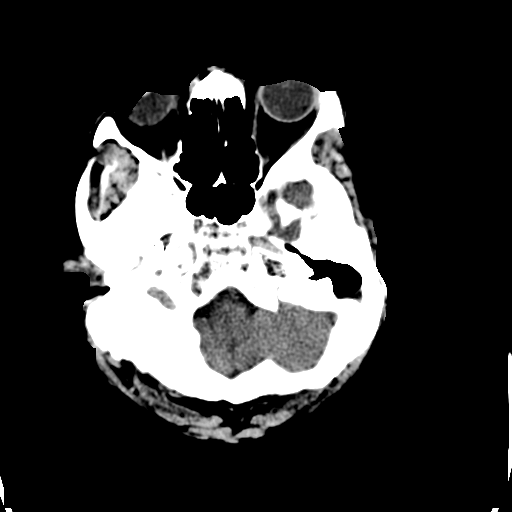
[im 9/36  brain]
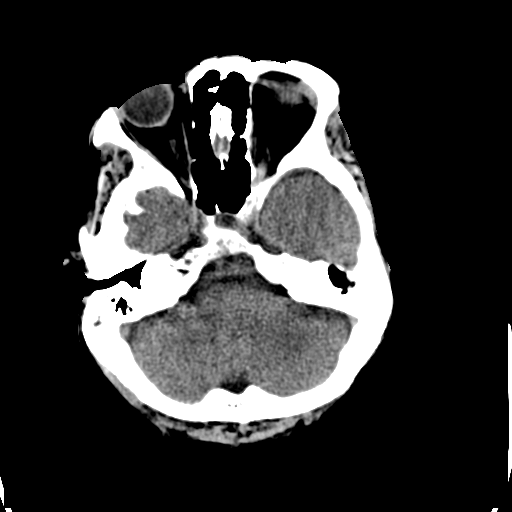
[im 10/36  brain]
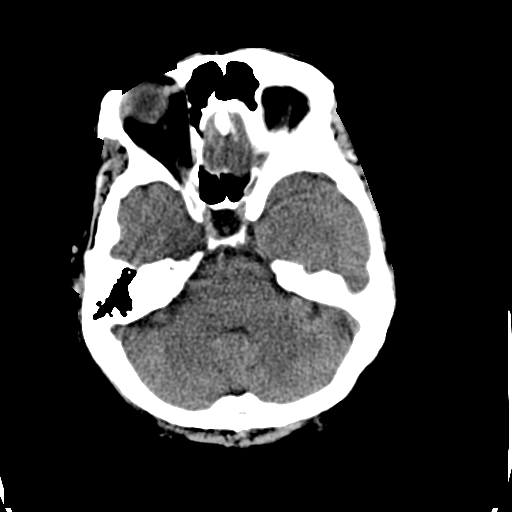
[im 10/36  bone]
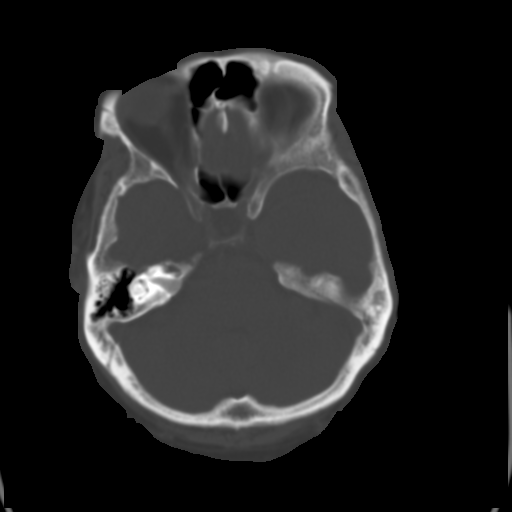
[im 13/36  brain]
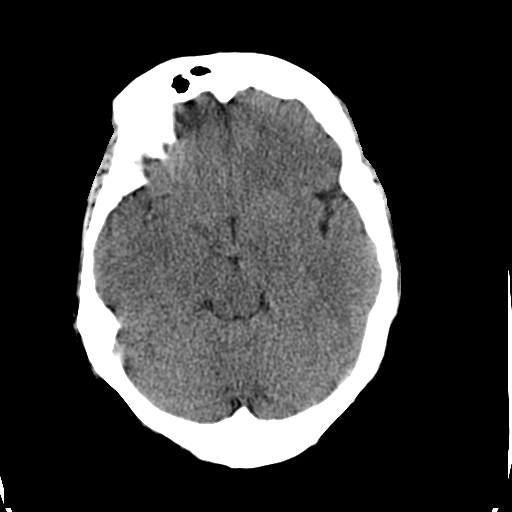
[im 15/36  brain]
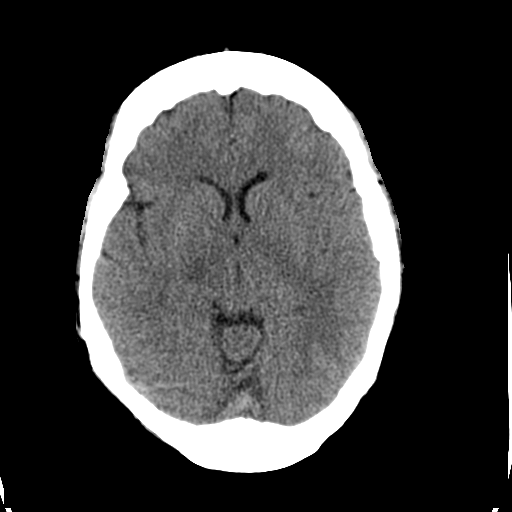
[im 17/36  brain]
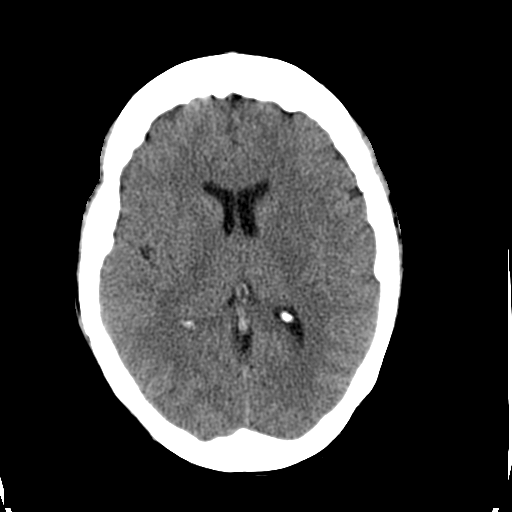
[im 19/36  brain]
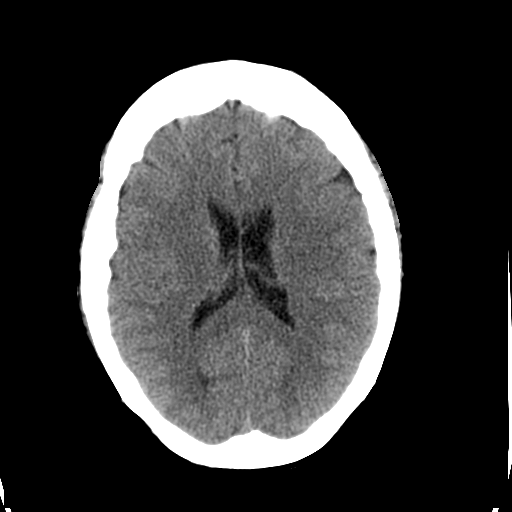
[im 19/36  bone]
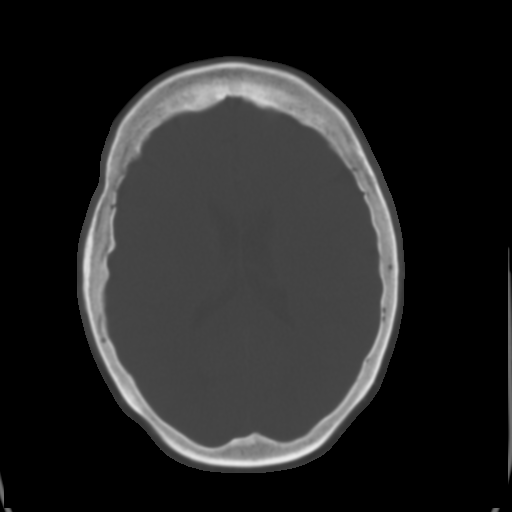
[im 21/36  brain]
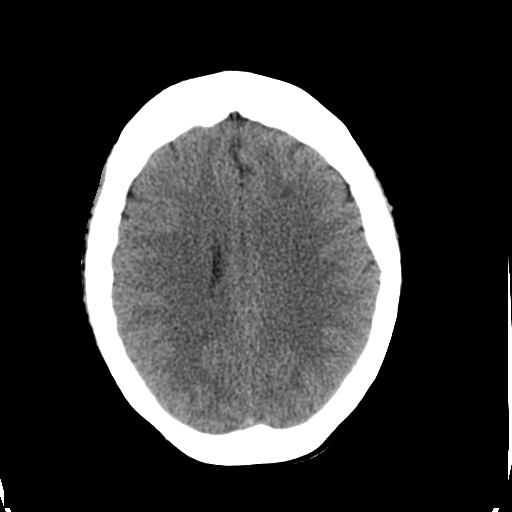
[im 23/36  brain]
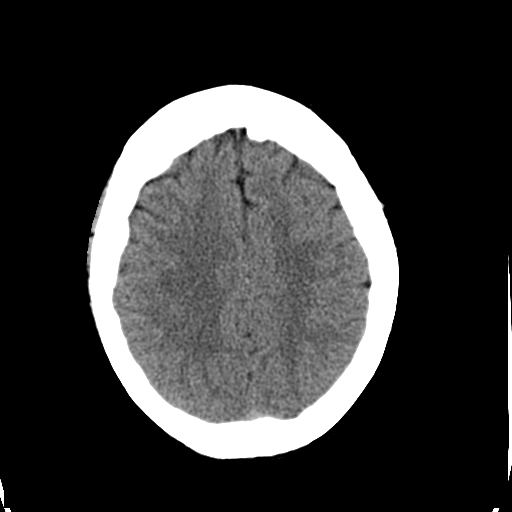
[im 26/36  brain]
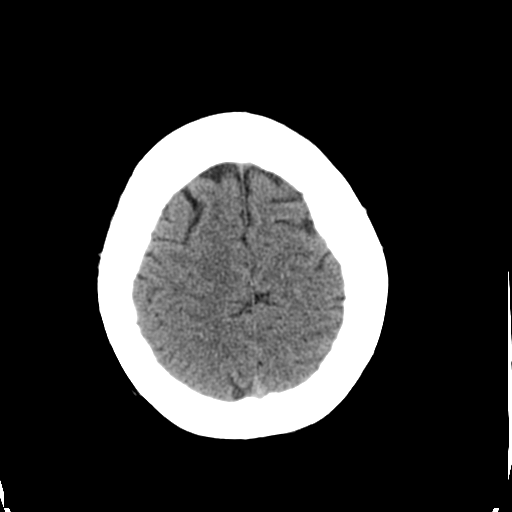
[im 27/36  brain]
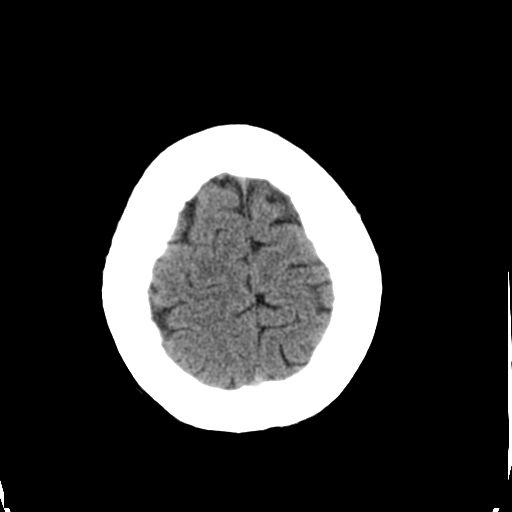
[im 27/36  bone]
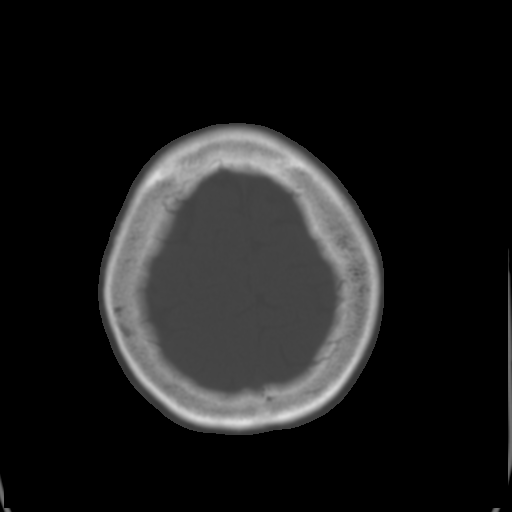
[im 29/36  brain]
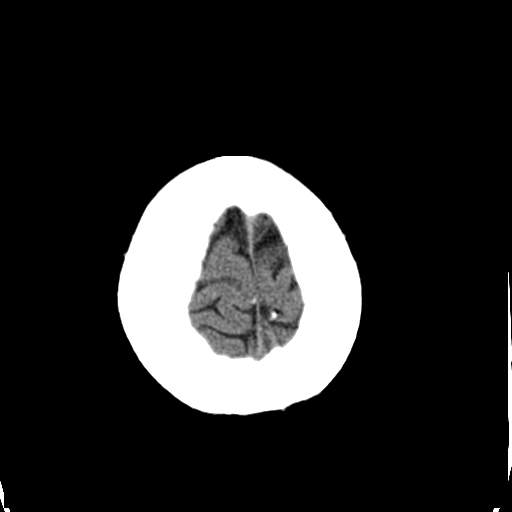
[im 32/36  brain]
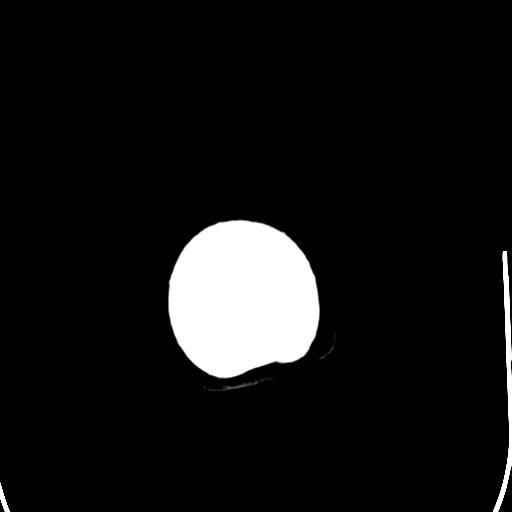
[im 34/36  brain]
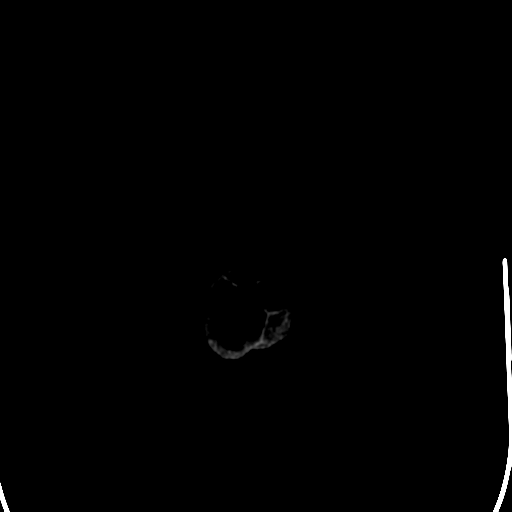

[16 of 30 positions shown; findings below may reference images not displayed]

FINDINGS: No skull fracture is noted. Paranasal sinuses are unremarkable.
Postsurgical changes are noted left mastoid region.

No intracranial hemorrhage, mass effect or midline shift.

No acute cortical infarction. The gray and white-matter
differentiation is preserved. No hydrocephalus. No intra or
extra-axial fluid collection. No mass lesion is noted on this
unenhanced scan.
IMPRESSION: No acute intracranial abnormality. No definite acute cortical
infarction.

## 2016-08-12 SURGERY — EGD (ESOPHAGOGASTRODUODENOSCOPY)
Anesthesia: Moderate Sedation

## 2016-08-12 MED ORDER — SODIUM CHLORIDE 0.9 % IV SOLN
INTRAVENOUS | Status: DC
  Administered 2016-08-12: 1000 mL via INTRAVENOUS

## 2016-08-12 MED ORDER — MIDAZOLAM HCL 5 MG/5ML IJ SOLN
INTRAMUSCULAR | Status: AC
  Filled 2016-08-12: qty 10

## 2016-08-12 MED ORDER — LIDOCAINE VISCOUS 2 % MT SOLN
OROMUCOSAL | Status: DC | PRN
  Administered 2016-08-12: 1 via OROMUCOSAL

## 2016-08-12 MED ORDER — STERILE WATER FOR IRRIGATION IR SOLN
Status: DC | PRN
  Administered 2016-08-12: 2.5 mL

## 2016-08-12 MED ORDER — MEPERIDINE HCL 100 MG/ML IJ SOLN
INTRAMUSCULAR | Status: AC
  Filled 2016-08-12: qty 2

## 2016-08-12 MED ORDER — LIDOCAINE VISCOUS 2 % MT SOLN
OROMUCOSAL | Status: AC
  Filled 2016-08-12: qty 15

## 2016-08-12 MED ORDER — ONDANSETRON HCL 4 MG/2ML IJ SOLN
INTRAMUSCULAR | Status: AC
  Filled 2016-08-12: qty 2

## 2016-08-12 MED ORDER — ONDANSETRON HCL 4 MG/2ML IJ SOLN
INTRAMUSCULAR | Status: DC | PRN
Start: 2016-08-12 — End: 2016-08-12
  Administered 2016-08-12: 4 mg via INTRAVENOUS

## 2016-08-12 MED ORDER — MEPERIDINE HCL 100 MG/ML IJ SOLN
INTRAMUSCULAR | Status: DC | PRN
  Administered 2016-08-12: 50 mg via INTRAVENOUS
  Administered 2016-08-12: 25 mg via INTRAVENOUS

## 2016-08-12 MED ORDER — MIDAZOLAM HCL 5 MG/5ML IJ SOLN
INTRAMUSCULAR | Status: DC | PRN
  Administered 2016-08-12 (×2): 2 mg via INTRAVENOUS

## 2016-08-12 NOTE — Op Note (Signed)
[facility] Patient Name: Dawn Beard Procedure Date: 08/12/2016 11:07 AM MRN: [id number] Date of Birth: 01-12-1956 Attending MD: Norvel Richards , MD CSN: [id number] Age: 60 Admit Type: Outpatient Procedure:                Upper GI endoscopy with gastric bx Indications:              Epigastric abdominal pain Providers:                Norvel Richards, MD, Lurline Del, RN, Purcell Nails.                            Bartow, Merchant [employer] officer Referring MD:              Medicines:                Midazolam 4 mg IV, Meperidine 75 mg IV Complications:            No immediate complications. Estimated Blood Loss:     Estimated blood loss was minimal. Procedure:                Pre-Anesthesia Assessment:                           - Prior to the procedure, a History and Physical                            was performed, and patient medications and                            allergies were reviewed. The patient's tolerance of                            previous anesthesia was also reviewed. The risks                            and benefits of the procedure and the sedation                            options and risks were discussed with the patient.                            All questions were answered, and informed consent                            was obtained. Prior Anticoagulants: The patient has                            taken no previous anticoagulant or antiplatelet                            agents. ASA Grade Assessment: III - A patient with                            severe systemic disease. After reviewing the risks  and benefits, the patient was deemed in                            satisfactory condition to undergo the procedure.                           After obtaining informed consent, the endoscope was                            passed under direct vision. Throughout the                            procedure, the patient's blood pressure, pulse,  and                            oxygen saturations were monitored continuously. The                            [id number] scope was introduced through the                            mouth, and advanced to the second part of duodenum.                            The upper GI endoscopy was accomplished without                            difficulty. The patient tolerated the procedure                            well. The upper GI endoscopy was accomplished                            without difficulty. Scope In: 11:16:41 AM Scope Out: 11:21:45 AM Total Procedure Duration: 0 hours 5 minutes 4 seconds  Findings:      The examined esophagus was normal.      Multiple erosions were found in the entire examined stomach. Congested       mucosa. Biopsies taken Estimated blood loss was minimal.      The duodenal bulb and second portion of the duodenum were normal. Impression:               - Normal esophagus.                           - Erosive gastropathy. Biopsied.                           - Normal duodenal bulb and second portion of the                            duodenum. Moderate Sedation:      Moderate (conscious) sedation was administered by the endoscopy nurse       and supervised by the endoscopist. The following parameters were       monitored: oxygen saturation, heart  rate, blood pressure, respiratory       rate, EKG, adequacy of pulmonary ventilation, and response to care.       Total physician intraservice time was 17 minutes. Recommendation:           - Patient has a contact number available for                            emergencies. The signs and symptoms of potential                            delayed complications were discussed with the                            patient. Return to normal activities tomorrow.                            Written discharge instructions were provided to the                            patient.                           - Resume previous  diet.                           - Continue present medications.                           - No repeat upper endoscopy.                           - Return to GI office after studies are complete. Procedure Code(s):        --- Professional ---                           [phone], Esophagogastroduodenoscopy, flexible,                            transoral; with biopsy, single or multiple                           99152, Moderate sedation services provided by the                            same physician or other qualified health care                            professional performing the diagnostic or                            therapeutic service that the sedation supports,                            requiring the presence of an independent trained  observer to assist in the monitoring of the                            patient's level of consciousness and physiological                            status; initial 15 minutes of intraservice time,                            patient age 28 years or older Diagnosis Code(s):        --- Professional ---                           K31.89, Other diseases of stomach and duodenum                           R10.13, Epigastric pain CPT copyright 2016 American Medical Association. All rights reserved. The codes documented in this report are preliminary and upon coder review may  be revised to meet current compliance requirements. Cristopher Estimable. Jackston [place], MD Norvel Richards, MD 08/12/2016 11:33:45 AM This report has been signed electronically. Number of Addenda: 0

## 2016-08-12 NOTE — Interval H&P Note (Signed)
History and Physical Interval Note:  08/12/2016 11:04 AM  Dawn Beard  has presented today for surgery, with the diagnosis of abd pain  The various methods of treatment have been discussed with the patient and family. After consideration of risks, benefits and other options for treatment, the patient has consented to  Procedure(s) with comments: ESOPHAGOGASTRODUODENOSCOPY (EGD) (N/A) - 1130 as a surgical intervention .  The patient's history has been reviewed, patient examined, no change in status, stable for surgery.  I have reviewed the patient's chart and labs.  Questions were answered to the patient's satisfaction.     No change. No dysphagia. Diagnostic EGD per plan.  The risks, benefits, limitations, alternatives and imponderables have been reviewed with the patient. Potential for esophageal dilation, biopsy, etc. have also been reviewed.  Questions have been answered. All parties agreeable.  Manus Rudd

## 2016-08-12 NOTE — H&P (View-Only) (Signed)
See lab result note. Needs return OV in 8 weeks.

## 2016-08-12 NOTE — Discharge Instructions (Signed)
EGD Discharge instructions Please read the instructions outlined below and refer to this sheet in the next few weeks. These discharge instructions provide you with general information on caring for yourself after you leave the hospital. Your doctor may also give you specific instructions. While your treatment has been planned according to the most current medical practices available, unavoidable complications occasionally occur. If you have any problems or questions after discharge, please call your doctor. ACTIVITY  You may resume your regular activity but move at a slower pace for the next 24 hours.   Take frequent rest periods for the next 24 hours.   Walking will help expel (get rid of) the air and reduce the bloated feeling in your abdomen.   No driving for 24 hours (because of the anesthesia (medicine) used during the test).   You may shower.   Do not sign any important legal documents or operate any machinery for 24 hours (because of the anesthesia used during the test).  NUTRITION  Drink plenty of fluids.   You may resume your normal diet.   Begin with a light meal and progress to your normal diet.   Avoid alcoholic beverages for 24 hours or as instructed by your caregiver.  MEDICATIONS  You may resume your normal medications unless your caregiver tells you otherwise.  WHAT YOU CAN EXPECT TODAY  You may experience abdominal discomfort such as a feeling of fullness or gas pains.  FOLLOW-UP  Your doctor will discuss the results of your test with you.  SEEK IMMEDIATE MEDICAL ATTENTION IF ANY OF THE FOLLOWING OCCUR:  Excessive nausea (feeling sick to your stomach) and/or vomiting.   Severe abdominal pain and distention (swelling).   Trouble swallowing.   Temperature over 101 F (37.8 C).   Rectal bleeding or vomiting of blood.    Stomach inflamed. Biopsies taken.  Further recommendations to follow pending review of pathology report

## 2016-08-14 ENCOUNTER — Encounter (HOSPITAL_COMMUNITY): Payer: Self-pay | Admitting: Internal Medicine

## 2016-08-16 ENCOUNTER — Encounter: Payer: Self-pay | Admitting: Internal Medicine

## 2016-08-17 ENCOUNTER — Telehealth: Payer: Self-pay

## 2016-08-17 NOTE — Telephone Encounter (Signed)
Letter mailed to the pt. 

## 2016-08-17 NOTE — Telephone Encounter (Signed)
Per RMR- Send letter to patient.  Send copy of letter with path to referring provider and PCP.   Recommend Stop omeprazole ; trial of Protonix 40 mg daily - disp 30 w 11 refills   ov w lsl in 4-6 weeks

## 2016-08-17 NOTE — Telephone Encounter (Signed)
OV made °

## 2016-08-19 MED ORDER — PANTOPRAZOLE SODIUM 40 MG PO TBEC: 40.0000 mg | DELAYED_RELEASE_TABLET | Freq: Every day | ORAL | 11 refills | Status: DC

## 2016-08-19 NOTE — Telephone Encounter (Signed)
rx has been sent in to the pharmacy.  

## 2016-09-17 IMAGING — DX DG CHEST 2V
2 series · 2 of 2 positions shown · non-contrast
Comparison: 10/20/2014

CLINICAL DATA: Radiating left leg pain, onset several weeks,
extending down to the toes. Pain is progressively worse today.
Numbness in the leg.

EXAM:
CHEST  2 VIEW

[chest pa]
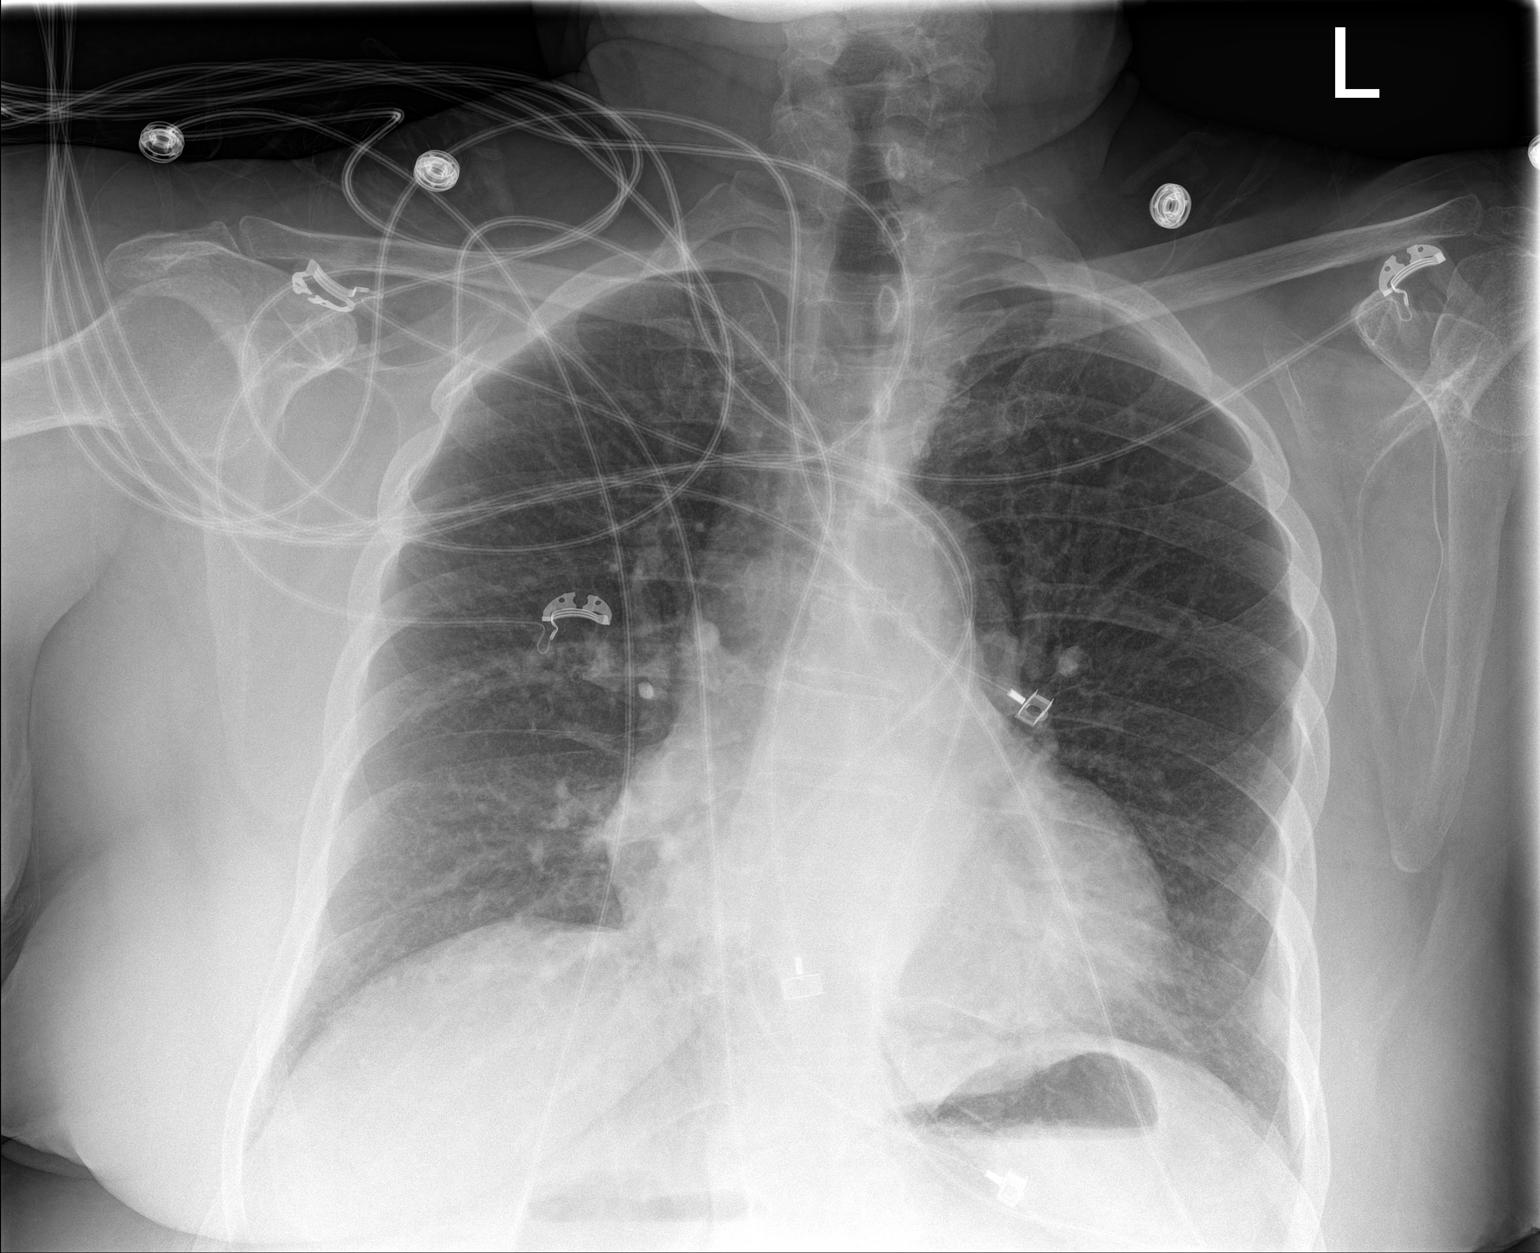

[chest lat]
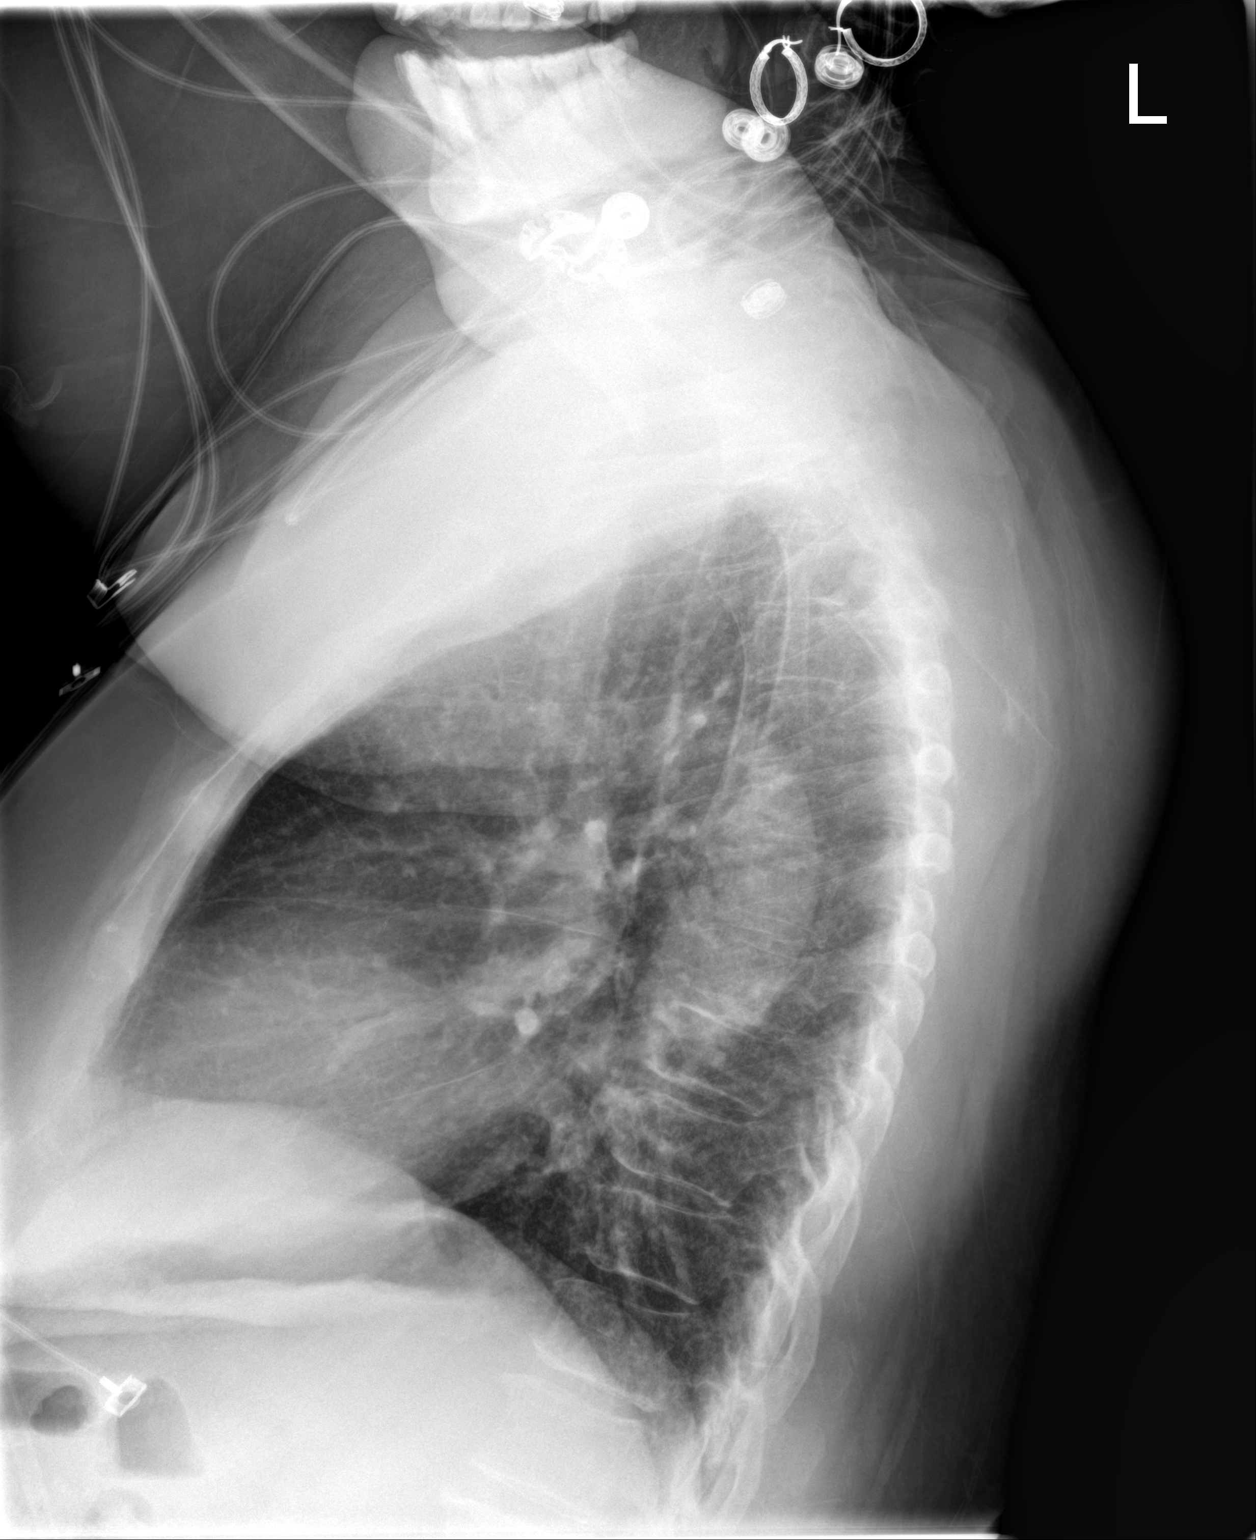

[2 of 2 positions shown; findings below may reference images not displayed]

FINDINGS: Mild cardiac enlargement with mild prominence of pulmonary
vascularity. This may suggest early congestive changes. No airspace
disease to suggest edema or pneumonia. No blunting of costophrenic
angles. No pneumothorax. Mild thoracic scoliosis convex towards the
right. Tortuous aorta.
IMPRESSION: Cardiac enlargement with mild pulmonary vascular congestion
suggested. No edema or consolidation.

## 2016-10-13 ENCOUNTER — Ambulatory Visit (INDEPENDENT_AMBULATORY_CARE_PROVIDER_SITE_OTHER): Payer: BLUE CROSS/BLUE SHIELD | Admitting: Gastroenterology

## 2016-10-13 ENCOUNTER — Telehealth: Payer: Self-pay | Admitting: Internal Medicine

## 2016-10-13 ENCOUNTER — Encounter: Payer: Self-pay | Admitting: Gastroenterology

## 2016-10-13 VITALS — BP 125/74 | HR 76 | Temp 97.9°F | Ht 66.0 in | Wt 231.8 lb

## 2016-10-13 DIAGNOSIS — K7290 Hepatic failure, unspecified without coma: Secondary | ICD-10-CM | POA: Diagnosis not present

## 2016-10-13 DIAGNOSIS — K7682 Hepatic encephalopathy: Secondary | ICD-10-CM

## 2016-10-13 DIAGNOSIS — K7460 Unspecified cirrhosis of liver: Secondary | ICD-10-CM

## 2016-10-13 LAB — AMMONIA: Ammonia: 175 umol/L — ABNORMAL HIGH (ref <=47)

## 2016-10-13 LAB — CBC WITH DIFFERENTIAL/PLATELET
BASOS PCT: 1 %
Basophils Absolute: 41 cells/uL (ref 0–200)
Eosinophils Absolute: 287 cells/uL (ref 15–500)
Eosinophils Relative: 7 %
HCT: 40.3 % (ref 35.0–45.0)
Hemoglobin: 13.6 g/dL (ref 11.7–15.5)
LYMPHS PCT: 26 %
Lymphs Abs: 1066 cells/uL (ref 850–3900)
MCH: 33.7 pg — ABNORMAL HIGH (ref 27.0–33.0)
MCHC: 33.7 g/dL (ref 32.0–36.0)
MCV: 99.8 fL (ref 80.0–100.0)
MONOS PCT: 10 %
Monocytes Absolute: 410 cells/uL (ref 200–950)
NEUTROS ABS: 2296 {cells}/uL (ref 1500–7800)
Neutrophils Relative %: 56 %
PLATELETS: 60 10*3/uL — AB (ref 140–400)
RBC: 4.04 MIL/uL (ref 3.80–5.10)
RDW: 16.1 % — AB (ref 11.0–15.0)
WBC: 4.1 10*3/uL (ref 3.8–10.8)

## 2016-10-13 LAB — COMPREHENSIVE METABOLIC PANEL
ALK PHOS: 227 U/L — AB (ref 33–130)
ALT: 26 U/L (ref 6–29)
AST: 46 U/L — AB (ref 10–35)
Albumin: 2.2 g/dL — ABNORMAL LOW (ref 3.6–5.1)
BILIRUBIN TOTAL: 2.7 mg/dL — AB (ref 0.2–1.2)
BUN: 8 mg/dL (ref 7–25)
CO2: 24 mmol/L (ref 20–31)
CREATININE: 0.56 mg/dL (ref 0.50–0.99)
Calcium: 9 mg/dL (ref 8.6–10.4)
Chloride: 114 mmol/L — ABNORMAL HIGH (ref 98–110)
Glucose, Bld: 98 mg/dL (ref 65–99)
Potassium: 4.2 mmol/L (ref 3.5–5.3)
Sodium: 142 mmol/L (ref 135–146)
Total Protein: 5.9 g/dL — ABNORMAL LOW (ref 6.1–8.1)

## 2016-10-13 LAB — PROTIME-INR
INR: 1.4 — ABNORMAL HIGH
Prothrombin Time: 14.8 s — ABNORMAL HIGH (ref 9.0–11.5)

## 2016-10-13 NOTE — Progress Notes (Signed)
Patient will be due for abdominal ultrasound April 2018 for hepatoma screening. Please NIC.

## 2016-10-13 NOTE — Telephone Encounter (Signed)
Lab called; ammonia 175.  Chart reviewed.  Called [phone] - got recording "no more calls accepted". After hours. We need to make contact with pt first thing in am.Lactulose needs to increase to 45cc qid x 2 days and then we should re-assess.  Goal is to titrate lactulose to 3-4 semiformed -almost loose stools daily.  Continue Xifaxin.

## 2016-10-13 NOTE — Progress Notes (Signed)
cc'ed to pcp °

## 2016-10-13 NOTE — Assessment & Plan Note (Signed)
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 When I last saw her she was having postprandial nausea, abdominal pain. EGD revealed erosive gastropathy, atrophic gastritis on biopsy. She is been back on PPI therapy, no longer on motor vehicle. Abdominal pain has resolved.  Possibly undergoing knee surgery in the near future. I have requested that patient's son make sure the orthopedic surgeon is aware that the patient has cirrhosis as this may help determine what type of anesthesia she would undergo. Unfortunately patient and spouse cannot recall the orthopedic surgeon's name today.  Continue to be concerned about episodes of confusion. She's been back on lactulose and cephalic some but has continued to have minor episodes with at least 2 significant episodes as outlined. She has a history of stroke. Discussed at length with spouse that sometimes cirrhotics will become somnolent and/or confused if there ammonia level is elevated. With next episode he should consider calling our office and we can check her ammonia level at that time and/or he should go to the emergency department as we cannot rule out potential stroke is being issue. He voiced understanding.  She is due for updated labs at this time. Check ammonia level now as well. Continue omeprazole, Xifaxan, lactulose. Avoid NSAIDs as much as possible.  She will be due for ultrasound next month for hepatoma screening, to be scheduled in the near future.

## 2016-10-13 NOTE — Progress Notes (Signed)
Primary Care Physician: Jana Half  Primary Gastroenterologist:  Garfield Cornea, MD   Chief Complaint  Patient presents with  . Abdominal Pain    f/u, no problems, had EGD 07/2016    HPI: Dawn Beard is a 61 y.o. female here for follow-up. She recently had an EGD in November for abdominal pain and nausea. She was found to have erosive gastropathy, biopsy with atrophic gastritis. No H. pylori. Patient has a history of [place] cirrhosis. Last colonoscopy October 2015 unremarkable. Abdominal ultrasound in October 2017 for hepatoma screening showed findings consistent with cirrhosis, no gallbladder disease. Last calculated meld score was in October and was 14.   Patient presents with her spouse and official interpreter. Patient spouse is concerned about several episodes where the patient was somnolent and somewhat confused. First time occurred back in November. Occurred again after traveling to [place] for visit in December. She has had milder episodes in the past. She seems to be awake but confused. When I saw patient back in October her son expressed similar concerns. At the time she had come off of her lactulose and Xifaxan. She has resumed both of these medications although takes lactulose only once daily. She describes having 2 soft BMs daily. No blood in the stool or melena. She had an endoscopy done for abdominal pain but states that her abdominal pain is completely resolved. She is now back on PPI therapy which was started at her last visit. She's had no nausea. She has a lot of problems with her knee and maybe having surgery in the upcoming future.   Current Outpatient Prescriptions  Medication Sig Dispense Refill  . lactulose (CHRONULAC) 10 GM/15ML solution Take 30 to 45 ml 3 to 4 times a day to have 2 to 3 soft bowel movements a day. (Patient taking differently: daily. Take 30 to 45 ml 3 to 4 times a day to have 2 to 3 soft bowel movements a day.  Takes once a  day) 1892 mL 3  . pantoprazole (PROTONIX) 40 MG tablet Take 1 tablet (40 mg total) by mouth daily. 30 tablet 11  . rifaximin (XIFAXAN) 550 MG TABS tablet TAKE 1 TABLET(550 MG) BY MOUTH TWICE DAILY 60 tablet 5  . traMADol (ULTRAM) 50 MG tablet Take 100 mg by mouth 4 (four) times daily as needed (break through pain).     No current facility-administered medications for this visit.     Allergies as of 10/13/2016 - Review Complete 10/13/2016  Allergen Reaction Noted  . Shrimp [shellfish allergy] Itching and Rash 04/08/2014   Past Medical History:  Diagnosis Date  . Arthritis   . Cirrhosis ([place])    likely NASH  . History of TIAs   . Splenomegaly 11/13/2015  . Stroke ([place])    slight   Past Surgical History:  Procedure Laterality Date  . ABDOMINAL HYSTERECTOMY    . APPENDECTOMY    . BIOPSY  08/12/2016   Procedure: BIOPSY;  Surgeon: Daneil Dolin, MD;  Location: [facility];  Service: Endoscopy;;  gastric biopsies  . COLONOSCOPY N/A 07/04/2014   Dr. Vivi Ferns  . ESOPHAGOGASTRODUODENOSCOPY N/A 07/04/2014   Dr. Rourk:normal esophagus, query portal gastropathy, chronic atrophic gastritis with intestinal metaplasia, screening EGD in 2-3 years  . ESOPHAGOGASTRODUODENOSCOPY N/A 08/12/2016   Rourk: erosive gastropathy, bx with atrophic gastritis  . EXTERNAL EAR SURGERY      ROS:  General: Negative for anorexia, weight loss, fever, chills, fatigue, weakness. ENT: Negative for hoarseness,  difficulty swallowing , nasal congestion. CV: Negative for chest pain, angina, palpitations, dyspnea on exertion, peripheral edema.  Respiratory: Negative for dyspnea at rest, dyspnea on exertion, cough, sputum, wheezing.  GI: See history of present illness. GU:  Negative for dysuria, hematuria, urinary incontinence, urinary frequency, nocturnal urination.  Endo: Negative for unusual weight change.    Physical Examination:   BP 125/74   Pulse 76   Temp 97.9 F (36.6 C) (Oral)   Ht 5\' 6"  (1.676  m)   Wt 231 lb 12.8 oz (105.1 kg)   BMI 37.41 kg/m   General: Obese, well-developed Hispanic female in no acute distress. Accompanied by her husband.   Eyes: No icterus. Mouth: Oropharyngeal mucosa moist and pink , no lesions erythema or exudate. Lungs: Clear to auscultation bilaterally.  Heart: Regular rate and rhythm, no murmurs rubs or gallops.  Abdomen: Bowel sounds are normal, nontender, nondistended, no hepatosplenomegaly or masses, no abdominal bruits or hernia , no rebound or guarding.   Extremities: No lower extremity edema. No clubbing or deformities. Neuro: Alert and oriented x 4   Skin: Warm and dry, no jaundice.   Psych: Alert and cooperative, normal mood and affect.  Labs:  Lab Results  Component Value Date   CREATININE 0.63 07/13/2016   BUN 10 07/13/2016   NA 142 07/13/2016   K 3.8 07/13/2016   CL 111 (H) 07/13/2016   CO2 25 07/13/2016   Lab Results  Component Value Date   ALT 27 07/13/2016   AST 53 (H) 07/13/2016   ALKPHOS 199 (H) 07/13/2016   BILITOT 2.5 (H) 07/13/2016   Lab Results  Component Value Date   WBC 3.9 07/13/2016   HGB 13.8 07/13/2016   HCT 40.4 07/13/2016   MCV 98.1 07/13/2016   PLT 63 (L) 07/13/2016   Lab Results  Component Value Date   INR 1.4 (H) 07/13/2016   INR 1.35 11/25/2014   INR 1.35 10/20/2014    Imaging Studies: No results found.

## 2016-10-13 NOTE — Patient Instructions (Signed)
1. Continue lactulose, Xifaxan, Pantoprazole as before.  2. Please have your labs done within next 1 week.  3. Please let me know if you have another episode of confusion or overly sleepy and we will check your ammonia level. If occurs at night time you can go to the ER.  4. Please have your son call and let us know if he informed the specialist about patient's cirrhosis prior to her having knee surgery.

## 2016-10-14 NOTE — Telephone Encounter (Signed)
Spoke to paitent's son, Shanon Brow. Advised him to have patient increase lactulose to 30-45cc four times daily over next couple of day to have 3-4 semiformed stools, may decrease (titrate) accordingly.   Currently patient was only taking once daily.  Continue Xifaxan bid.   He also reports her knee surgery will be in about one month with [facility].   Almyra Free, please contact orthropedic's office and just make sure they aware of her cirrhosis status as this would be important with regards to anesthesia and surgery.

## 2016-10-14 NOTE — Telephone Encounter (Signed)
Tried to call patient's son, Shanon Brow, who speaks [place]. No answer. Will try again later.

## 2016-10-20 NOTE — Telephone Encounter (Signed)
Called and spoke with Crystal at [facility]. I have faxed a copy of the last ov note and this telephone note to [phone]

## 2016-11-10 ENCOUNTER — Encounter: Payer: Self-pay | Admitting: Gastroenterology

## 2016-11-10 NOTE — Progress Notes (Signed)
Please send copy of this letter to Dr. Berenice Primas. Fax [phone].

## 2016-11-10 NOTE — Progress Notes (Signed)
Patient's son aware of results, see separate telephone encounter.   MELD 14. Chronic thrombocytopenia, elevated INR.   Due next labs in 3 months. PT/INR, CMET, CBC.

## 2016-11-12 ENCOUNTER — Other Ambulatory Visit: Payer: Self-pay | Admitting: Gastroenterology

## 2016-11-12 DIAGNOSIS — D696 Thrombocytopenia, unspecified: Secondary | ICD-10-CM

## 2016-11-12 DIAGNOSIS — K7460 Unspecified cirrhosis of liver: Secondary | ICD-10-CM

## 2016-12-24 ENCOUNTER — Other Ambulatory Visit: Payer: Self-pay

## 2016-12-24 DIAGNOSIS — K7460 Unspecified cirrhosis of liver: Secondary | ICD-10-CM

## 2016-12-24 DIAGNOSIS — D696 Thrombocytopenia, unspecified: Secondary | ICD-10-CM

## 2017-01-11 ENCOUNTER — Ambulatory Visit: Payer: BLUE CROSS/BLUE SHIELD | Admitting: Gastroenterology

## 2017-01-22 ENCOUNTER — Other Ambulatory Visit: Payer: Self-pay | Admitting: Gastroenterology

## 2017-02-09 LAB — CBC WITH DIFFERENTIAL/PLATELET
Basophils Absolute: 28 cells/uL (ref 0–200)
Basophils Relative: 1 %
EOS ABS: 280 {cells}/uL (ref 15–500)
Eosinophils Relative: 10 %
HEMATOCRIT: 40.1 % (ref 35.0–45.0)
Hemoglobin: 13.9 g/dL (ref 11.7–15.5)
Lymphocytes Relative: 45 %
Lymphs Abs: 1260 cells/uL (ref 850–3900)
MCH: 34.7 pg — ABNORMAL HIGH (ref 27.0–33.0)
MCHC: 34.7 g/dL (ref 32.0–36.0)
MCV: 100 fL (ref 80.0–100.0)
MONO ABS: 224 {cells}/uL (ref 200–950)
Monocytes Relative: 8 %
NEUTROS ABS: 1008 {cells}/uL — AB (ref 1500–7800)
Neutrophils Relative %: 36 %
Platelets: 54 10*3/uL — ABNORMAL LOW (ref 140–400)
RBC: 4.01 MIL/uL (ref 3.80–5.10)
RDW: 15.9 % — ABNORMAL HIGH (ref 11.0–15.0)
WBC: 2.8 10*3/uL — AB (ref 3.8–10.8)

## 2017-02-09 LAB — COMPREHENSIVE METABOLIC PANEL
ALBUMIN: 2.4 g/dL — AB (ref 3.6–5.1)
ALT: 34 U/L — ABNORMAL HIGH (ref 6–29)
AST: 74 U/L — ABNORMAL HIGH (ref 10–35)
Alkaline Phosphatase: 226 U/L — ABNORMAL HIGH (ref 33–130)
BUN: 7 mg/dL (ref 7–25)
CHLORIDE: 108 mmol/L (ref 98–110)
CO2: 26 mmol/L (ref 20–31)
Calcium: 9.1 mg/dL (ref 8.6–10.4)
Creat: 0.49 mg/dL — ABNORMAL LOW (ref 0.50–0.99)
Glucose, Bld: 85 mg/dL (ref 65–99)
POTASSIUM: 4.1 mmol/L (ref 3.5–5.3)
Sodium: 138 mmol/L (ref 135–146)
TOTAL PROTEIN: 6.2 g/dL (ref 6.1–8.1)
Total Bilirubin: 3.7 mg/dL — ABNORMAL HIGH (ref 0.2–1.2)

## 2017-02-09 LAB — PROTIME-INR
INR: 1.6 — ABNORMAL HIGH
Prothrombin Time: 16.3 s — ABNORMAL HIGH (ref 9.0–11.5)

## 2017-02-10 ENCOUNTER — Other Ambulatory Visit: Payer: Self-pay

## 2017-02-10 ENCOUNTER — Encounter: Payer: Self-pay | Admitting: Internal Medicine

## 2017-02-10 DIAGNOSIS — K7460 Unspecified cirrhosis of liver: Secondary | ICD-10-CM

## 2017-02-10 NOTE — Progress Notes (Signed)
Labs essentially stable. MELD 17.  She was due for ruq u/s in 12/2016 for hepatoma screening. Please schedule.   PLEASE MAKE OV WITH RMR ONLY FIRST AVAILABLE, NONURGENT. REASON FOR OV, CIRRHOSIS, ?TRANSPLANT CENTER REFERRAL/MELD 17.

## 2017-02-18 ENCOUNTER — Ambulatory Visit (HOSPITAL_COMMUNITY): Admission: RE | Admit: 2017-02-18 | Payer: BLUE CROSS/BLUE SHIELD | Source: Ambulatory Visit

## 2017-03-16 ENCOUNTER — Other Ambulatory Visit: Payer: Self-pay | Admitting: Gastroenterology

## 2017-03-28 ENCOUNTER — Emergency Department (HOSPITAL_COMMUNITY): Payer: BLUE CROSS/BLUE SHIELD

## 2017-03-28 ENCOUNTER — Encounter (HOSPITAL_COMMUNITY): Payer: Self-pay | Admitting: Emergency Medicine

## 2017-03-28 ENCOUNTER — Inpatient Hospital Stay (HOSPITAL_COMMUNITY)
Admission: EM | Admit: 2017-03-28 | Discharge: 2017-03-30 | DRG: 442 | Disposition: A | Discharge status: Home / Self Care | Payer: Self-pay | Attending: Internal Medicine | Admitting: Internal Medicine

## 2017-03-28 DIAGNOSIS — N390 Urinary tract infection, site not specified: Secondary | ICD-10-CM | POA: Diagnosis present

## 2017-03-28 DIAGNOSIS — Z91013 Allergy to seafood: Secondary | ICD-10-CM

## 2017-03-28 DIAGNOSIS — R161 Splenomegaly, not elsewhere classified: Secondary | ICD-10-CM | POA: Diagnosis present

## 2017-03-28 DIAGNOSIS — R410 Disorientation, unspecified: Secondary | ICD-10-CM

## 2017-03-28 DIAGNOSIS — J811 Chronic pulmonary edema: Secondary | ICD-10-CM | POA: Diagnosis present

## 2017-03-28 DIAGNOSIS — Z79899 Other long term (current) drug therapy: Secondary | ICD-10-CM

## 2017-03-28 DIAGNOSIS — G9340 Encephalopathy, unspecified: Secondary | ICD-10-CM | POA: Diagnosis present

## 2017-03-28 DIAGNOSIS — D689 Coagulation defect, unspecified: Secondary | ICD-10-CM | POA: Diagnosis present

## 2017-03-28 DIAGNOSIS — K7581 Nonalcoholic steatohepatitis (NASH): Secondary | ICD-10-CM | POA: Diagnosis present

## 2017-03-28 DIAGNOSIS — K7200 Acute and subacute hepatic failure without coma: Principal | ICD-10-CM | POA: Diagnosis present

## 2017-03-28 DIAGNOSIS — D684 Acquired coagulation factor deficiency: Secondary | ICD-10-CM | POA: Diagnosis present

## 2017-03-28 DIAGNOSIS — Z6841 Body Mass Index (BMI) 40.0 and over, adult: Secondary | ICD-10-CM

## 2017-03-28 DIAGNOSIS — D696 Thrombocytopenia, unspecified: Secondary | ICD-10-CM | POA: Diagnosis present

## 2017-03-28 DIAGNOSIS — K7460 Unspecified cirrhosis of liver: Secondary | ICD-10-CM | POA: Diagnosis present

## 2017-03-28 DIAGNOSIS — I868 Varicose veins of other specified sites: Secondary | ICD-10-CM | POA: Diagnosis present

## 2017-03-28 DIAGNOSIS — D61818 Other pancytopenia: Secondary | ICD-10-CM | POA: Diagnosis present

## 2017-03-28 DIAGNOSIS — Z8673 Personal history of transient ischemic attack (TIA), and cerebral infarction without residual deficits: Secondary | ICD-10-CM

## 2017-03-28 DIAGNOSIS — D72819 Decreased white blood cell count, unspecified: Secondary | ICD-10-CM | POA: Diagnosis present

## 2017-03-28 DIAGNOSIS — R52 Pain, unspecified: Secondary | ICD-10-CM

## 2017-03-28 DIAGNOSIS — D7589 Other specified diseases of blood and blood-forming organs: Secondary | ICD-10-CM | POA: Diagnosis present

## 2017-03-28 LAB — COMPREHENSIVE METABOLIC PANEL
ALT: 38 U/L (ref 14–54)
AST: 64 U/L — AB (ref 15–41)
Albumin: 2.6 g/dL — ABNORMAL LOW (ref 3.5–5.0)
Alkaline Phosphatase: 244 U/L — ABNORMAL HIGH (ref 38–126)
Anion gap: 6 (ref 5–15)
BUN: 11 mg/dL (ref 6–20)
CHLORIDE: 117 mmol/L — AB (ref 101–111)
CO2: 22 mmol/L (ref 22–32)
CREATININE: 0.52 mg/dL (ref 0.44–1.00)
Calcium: 10.1 mg/dL (ref 8.9–10.3)
Glucose, Bld: 94 mg/dL (ref 65–99)
POTASSIUM: 4.2 mmol/L (ref 3.5–5.1)
SODIUM: 145 mmol/L (ref 135–145)
Total Bilirubin: 5 mg/dL — ABNORMAL HIGH (ref 0.3–1.2)
Total Protein: 6.7 g/dL (ref 6.5–8.1)

## 2017-03-28 LAB — CBC WITH DIFFERENTIAL/PLATELET
BASOS ABS: 0 10*3/uL (ref 0.0–0.1)
BASOS PCT: 1 %
EOS ABS: 0.1 10*3/uL (ref 0.0–0.7)
EOS PCT: 3 %
HCT: 42.4 % (ref 36.0–46.0)
Hemoglobin: 14.1 g/dL (ref 12.0–15.0)
LYMPHS PCT: 24 %
Lymphs Abs: 0.7 10*3/uL (ref 0.7–4.0)
MCH: 34.6 pg — ABNORMAL HIGH (ref 26.0–34.0)
MCHC: 33.3 g/dL (ref 30.0–36.0)
MCV: 103.9 fL — AB (ref 78.0–100.0)
MONO ABS: 0.2 10*3/uL (ref 0.1–1.0)
Monocytes Relative: 6 %
Neutro Abs: 2 10*3/uL (ref 1.7–7.7)
Neutrophils Relative %: 66 %
PLATELETS: 51 10*3/uL — AB (ref 150–400)
RBC: 4.08 MIL/uL (ref 3.87–5.11)
RDW: 16.5 % — AB (ref 11.5–15.5)
WBC: 3.1 10*3/uL — AB (ref 4.0–10.5)

## 2017-03-28 LAB — URINALYSIS, ROUTINE W REFLEX MICROSCOPIC
BILIRUBIN URINE: NEGATIVE
Glucose, UA: NEGATIVE mg/dL
KETONES UR: NEGATIVE mg/dL
NITRITE: NEGATIVE
PROTEIN: NEGATIVE mg/dL
Specific Gravity, Urine: 1.009 (ref 1.005–1.030)
pH: 7 (ref 5.0–8.0)

## 2017-03-28 LAB — AMMONIA: Ammonia: 121 umol/L — ABNORMAL HIGH (ref 9–35)

## 2017-03-28 LAB — LIPASE, BLOOD: LIPASE: 44 U/L (ref 11–51)

## 2017-03-28 MED ORDER — LACTULOSE 10 GM/15ML PO SOLN
30.0000 g | Freq: Four times a day (QID) | ORAL | Status: DC | PRN
  Filled 2017-03-28: qty 45

## 2017-03-28 MED ORDER — PANTOPRAZOLE SODIUM 40 MG PO TBEC
40.0000 mg | DELAYED_RELEASE_TABLET | Freq: Every day | ORAL | Status: DC
  Administered 2017-03-28 – 2017-03-30 (×3): 40 mg via ORAL
  Filled 2017-03-28 (×3): qty 1

## 2017-03-28 MED ORDER — ONDANSETRON HCL 4 MG/2ML IJ SOLN: 4.0000 mg | Freq: Four times a day (QID) | INTRAMUSCULAR | Status: DC | PRN

## 2017-03-28 MED ORDER — LACTATED RINGERS IV SOLN
INTRAVENOUS | Status: DC
  Administered 2017-03-28: 19:00:00 via INTRAVENOUS

## 2017-03-28 MED ORDER — ONDANSETRON HCL 4 MG PO TABS: 4.0000 mg | ORAL_TABLET | Freq: Four times a day (QID) | ORAL | Status: DC | PRN

## 2017-03-28 MED ORDER — CLINDAMYCIN PHOSPHATE 600 MG/50ML IV SOLN
INTRAVENOUS | Status: AC
  Filled 2017-03-28: qty 100

## 2017-03-28 MED ORDER — TRAMADOL HCL 50 MG PO TABS: 50.0000 mg | ORAL_TABLET | Freq: Four times a day (QID) | ORAL | Status: DC | PRN

## 2017-03-28 MED ORDER — LACTULOSE 10 GM/15ML PO SOLN
45.0000 g | Freq: Once | ORAL | Status: AC
  Administered 2017-03-28: 45 g via ORAL
  Filled 2017-03-28: qty 90

## 2017-03-28 MED ORDER — RIFAXIMIN 550 MG PO TABS
550.0000 mg | ORAL_TABLET | Freq: Two times a day (BID) | ORAL | Status: DC
  Administered 2017-03-28 – 2017-03-30 (×4): 550 mg via ORAL
  Filled 2017-03-28 (×4): qty 1

## 2017-03-28 MED ORDER — ONDANSETRON HCL 4 MG/2ML IJ SOLN
4.0000 mg | Freq: Once | INTRAMUSCULAR | Status: AC
  Administered 2017-03-28: 4 mg via INTRAVENOUS
  Filled 2017-03-28: qty 2

## 2017-03-28 MED ORDER — CLINDAMYCIN PHOSPHATE 600 MG/50ML IV SOLN
600.0000 mg | Freq: Three times a day (TID) | INTRAVENOUS | Status: DC
  Administered 2017-03-28 – 2017-03-30 (×5): 600 mg via INTRAVENOUS
  Filled 2017-03-28 (×9): qty 50

## 2017-03-28 NOTE — ED Triage Notes (Signed)
Pt reports abd pain and headache for the past 2 days.  Son states she has been confused and this happens with her "liver problems."

## 2017-03-28 NOTE — H&P (Signed)
History and Physical    Dawn Beard:[id number] DOB: 04/18/1956 DOA: 03/28/2017  PCP:  Jarvis [facility]:  Rourk - GI Patient coming from: Home - lives with husband and son; NOK:  husband, [phone]  Chief Complaint: confusion  HPI: Dawn Beard is a 61 y.o. female with medical history significant of cirrhosis from NASH presenting with AMS.  She is solely Spanish-speaking and history was obtained with her son translating.  He reports that she has been "sick" x 3 days.  N/V, confusion.  This is how she usually presents when her liver is acting up (3 years).  +headache - occipital.   Abdominal pain.  Chills but no fever.  Sleeping all the time.  Feels dizzy.  Mouth was drooping this AM.  +dysuria.  No blood in her urine.     ED Course: Hepatic encephalopathy - given Lactulose and Zofran.  UA pending.  Review of Systems: As per HPI; otherwise review of systems reviewed and negative.   Ambulatory Status:  Ambulates with a cane  Past Medical History:  Diagnosis Date  . Arthritis   . Cirrhosis ([place])    likely NASH  . History of TIAs   . Splenomegaly 11/13/2015    Past Surgical History:  Procedure Laterality Date  . ABDOMINAL HYSTERECTOMY    . APPENDECTOMY    . BIOPSY  08/12/2016   Procedure: BIOPSY;  Surgeon: Daneil Dolin, MD;  Location: [facility];  Service: Endoscopy;;  gastric biopsies  . COLONOSCOPY N/A 07/04/2014   Dr. Vivi Ferns  . ESOPHAGOGASTRODUODENOSCOPY N/A 07/04/2014   Dr. Rourk:normal esophagus, query portal gastropathy, chronic atrophic gastritis with intestinal metaplasia, screening EGD in 2-3 years  . ESOPHAGOGASTRODUODENOSCOPY N/A 08/12/2016   Rourk: erosive gastropathy, bx with atrophic gastritis  . EXTERNAL EAR SURGERY      Social History   Social History  . Marital status: Married    Spouse name: N/A  . Number of children: N/A  . Years of education: N/A   Occupational History  . unemployed    Social  History Main Topics  . Smoking status: Never Smoker  . Smokeless tobacco: Never Used     Comment: Never smoked  . Alcohol use No  . Drug use: No  . Sexual activity: Not on file   Other Topics Concern  . Not on file   Social History Narrative  . No narrative on file    Allergies  Allergen Reactions  . Shrimp [Shellfish Allergy] Itching and Rash    Family History  Problem Relation Age of Onset  . Liver cancer Mother        ? pancreatic cancer  . Colon cancer Neg Hx     Prior to Admission medications   Medication Sig Start Date End Date Taking? Authorizing Provider  GENERLAC 10 GM/15ML SOLN TAKE 30 TO 45 ML BY MOUTH THREE TIMES DAILY TO FOUR TIMES DAILY TO HAVE 2 TO 3 SPFT BOWEL MOVEMENTS A DAY 03/17/17  Yes Walden Field A, NP  pantoprazole (PROTONIX) 40 MG tablet Take 1 tablet (40 mg total) by mouth daily. 08/19/16  Yes Rourk, Cristopher Estimable, MD  traMADol (ULTRAM) 50 MG tablet Take 100 mg by mouth 4 (four) times daily as needed (break through pain).   Yes [provider]  triamcinolone cream (KENALOG) 0.5 % Apply 1 application topically 2 (two) times daily as needed. 03/18/17  Yes [provider]  XIFAXAN 550 MG TABS tablet TAKE 1 TABLET(550 MG) BY MOUTH TWICE  DAILY 01/25/17  Yes Annitta Needs, NP    Physical Exam: Vitals:   03/28/17 1509 03/28/17 1630 03/28/17 1800 03/28/17 1836  BP:  122/65 124/68 (!) 124/55  Pulse: 71 72 75 68  Resp:    16  Temp:    98.5 F (36.9 C)  TempSrc:    Oral  SpO2: 96% 97% 99% 99%  Weight:    100.3 kg (221 lb 1.9 oz)  Height:    5\' 1"  (1.549 m)     General:  Appears calm and comfortable and is NAD Eyes:  PERRL, EOMI, normal lids, iris ENT:  grossly normal hearing, lips & tongue, mmm Neck:  no LAD, masses or thyromegaly Cardiovascular:  RRR, no m/r/g. No LE edema.  Respiratory:  CTA bilaterally, no w/r/r. Normal respiratory effort. Abdomen:  soft, TTP in LLQ, nd, NABS Skin:  no rash or induration seen on limited  exam Musculoskeletal:  grossly normal tone BUE/BLE, good ROM, no bony abnormality Psychiatric:  grossly normal mood and affect, speech fluent and appropriate, AOx3 - as best as can be determined via interpreter Neurologic:  CN 2-12 grossly intact, moves all extremities in coordinated fashion, sensation intact  Labs on Admission: I have personally reviewed following labs and imaging studies  CBC:  Recent Labs Lab 03/28/17 1509  WBC 3.1*  NEUTROABS 2.0  HGB 14.1  HCT 42.4  MCV 103.9*  PLT 51*   Basic Metabolic Panel:  Recent Labs Lab 03/28/17 1502  NA 145  K 4.2  CL 117*  CO2 22  GLUCOSE 94  BUN 11  CREATININE 0.52  CALCIUM 10.1   GFR: Estimated Creatinine Clearance: 80.2 mL/min (by C-G formula based on SCr of 0.52 mg/dL). Liver Function Tests:  Recent Labs Lab 03/28/17 1502  AST 64*  ALT 38  ALKPHOS 244*  BILITOT 5.0*  PROT 6.7  ALBUMIN 2.6*    Recent Labs Lab 03/28/17 1502  LIPASE 44    Recent Labs Lab 03/28/17 1502  AMMONIA 121*   Coagulation Profile: No results for input(s): INR, PROTIME in the last 168 hours. Cardiac Enzymes: No results for input(s): CKTOTAL, CKMB, CKMBINDEX, TROPONINI in the last 168 hours. BNP (last 3 results) No results for input(s): PROBNP in the last 8760 hours. HbA1C: No results for input(s): HGBA1C in the last 72 hours. CBG: No results for input(s): GLUCAP in the last 168 hours. Lipid Profile: No results for input(s): CHOL, HDL, LDLCALC, TRIG, CHOLHDL, LDLDIRECT in the last 72 hours. Thyroid Function Tests: No results for input(s): TSH, T4TOTAL, FREET4, T3FREE, THYROIDAB in the last 72 hours. Anemia Panel: No results for input(s): VITAMINB12, FOLATE, FERRITIN, TIBC, IRON, RETICCTPCT in the last 72 hours. Urine analysis:    Component Value Date/Time   COLORURINE YELLOW 03/28/2017 1407   APPEARANCEUR HAZY (A) 03/28/2017 1407   LABSPEC 1.009 03/28/2017 1407   PHURINE 7.0 03/28/2017 1407   GLUCOSEU NEGATIVE  03/28/2017 1407   HGBUR SMALL (A) 03/28/2017 1407   BILIRUBINUR NEGATIVE 03/28/2017 1407   KETONESUR NEGATIVE 03/28/2017 1407   PROTEINUR NEGATIVE 03/28/2017 1407   UROBILINOGEN 0.2 07/26/2015 1429   NITRITE NEGATIVE 03/28/2017 1407   LEUKOCYTESUR MODERATE (A) 03/28/2017 1407    Creatinine Clearance: Estimated Creatinine Clearance: 80.2 mL/min (by C-G formula based on SCr of 0.52 mg/dL).  Sepsis Labs: @LABRCNTIP (procalcitonin:4,lacticidven:4) )No results found for this or any previous visit (from the past 240 hour(s)).   Radiological Exams on Admission: Dg Chest 2 View  Result Date: 03/28/2017 CLINICAL DATA:  61 year old female  with shortness of breath, confusion, weakness. Cirrhosis. EXAM: CHEST  2 VIEW COMPARISON:  11/25/2014 and earlier. FINDINGS: Upright AP and lateral views of the chest. Mild cardiomegaly. Other mediastinal contours are within normal limits. Visualized tracheal air column is within normal limits. Interval mildly increased interstitial prominence with basilar predominance. No pneumothorax, pleural effusion or consolidation. Osteopenia. No acute osseous abnormality identified. Negative visible bowel gas pattern. IMPRESSION: 1. Increased pulmonary interstitial opacity, favor vascular congestion or mild interstitial edema. Viral/atypical respiratory infection fell less likely. 2. Mild cardiomegaly. Electronically Signed   By: Genevie Ann M.D.   On: 03/28/2017 14:36   Ct Renal Stone Study  Result Date: 03/28/2017 CLINICAL DATA:  Abdominal pain, nausea vomiting. EXAM: CT ABDOMEN AND PELVIS WITHOUT CONTRAST TECHNIQUE: Multidetector CT imaging of the abdomen and pelvis was performed following the standard protocol without IV contrast. COMPARISON:  12/24/2015 FINDINGS: Lower chest: No acute abnormality. Hepatobiliary: Nodular contour of the liver. Normal noncontrasted appearance of the gallbladder. Pancreas: Unremarkable. No pancreatic ductal dilatation or surrounding inflammatory  changes. Spleen: Enlarged spleen with prominent splenic varices. Adrenals/Urinary Tract: Adrenal glands are unremarkable. Kidneys are without focal lesion, or hydronephrosis. Tiny 2 mm nonobstructive calculus in the lower pole of the left kidney. Mild diffuse thickening of the urinary bladder wall. Small amount of air counter dependently within the urinary bladder. Stomach/Bowel: Stomach is within normal limits. Appendix is not seen. No evidence of bowel wall thickening, distention, or inflammatory changes. Vascular/Lymphatic: No significant vascular findings are present. No enlarged abdominal or pelvic lymph nodes. Reproductive: Status post hysterectomy. No adnexal masses. Other: Small amount of free fluid in the pelvis. Stable coarse calcifications along the anterior surface of the left psoas muscle. Small fat containing periumbilical hernia. Musculoskeletal: No acute or significant osseous findings. Multilevel osteoarthritic changes. IMPRESSION: No evidence of obstructive uropathy. 2 mm left nonobstructive renal calculus. Diffuse thickening of the urinary bladder wall ; small amount of air within the urinary bladder. If no history of recent instrumentation, consider cystitis with gas-forming organism. Stigmata of cirrhosis with splenomegaly and prominent splenic varices. Small amount of free fluid in the pelvis. Electronically Signed   By: Fidela Salisbury M.D.   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 -Presenting with acute onset of AMS over the last 3 days -She has been  taking Lactulose BID but not TID-QID based on stool consistency as prescribed due to lack of understanding  -Ammonia 121; previously 175 on 10/13/16 -He does not appear to have new electrolyte abnormalities, worsening renal failure, hypoglycemia, or other apparent triggers  -Regardless of the cause, she does have AMS and needs empiric treatment for encephalopathy with lactulose titrated to 2-3 soft stools/day -Continue Rifaximin -If this is the cause, would anticipate improvement in mental status in the next 12-24 hours -If not improving, she likely needs a more extensive evaluation for causes of her AMS -MELD/MELD-Na score is 18, with a mortality rate of 6%  UTI -This is also a possible cause for her AMS -She reports dysuria and abdominal pain (mostly LLQ) -UA is consistent with UTI but not overly concerning -CT is concerning for a  gas-forming organism -Urine culture pending -Will cover empirically with Clindamycin for now  Thrombocytopenia -Platelets are 50 -This is stable to slightly worse than her baseline -Avoid heparin-containing products -Monitor -No indication for transfusion unless <20k   DVT prophylaxis:  SCDs Code Status:  Full - confirmed with patient/family Family Communication: Husband and son present throughout evaluation  Disposition Plan:  Home once clinically improved Consults called: None at this time Admission status: It is my clinical opinion that referral for OBSERVATION is reasonable and necessary in this patient based on the above information provided. The aforementioned taken together are felt to place the patient at high risk for further clinical deterioration. However it is anticipated that the patient may be medically stable for discharge from the hospital within 24 to 48 hours.     Karmen Bongo MD [facility]  If 7PM-7AM, please contact night-coverage [email or web address] Password [email or web address]  03/28/2017, 7:40 PM

## 2017-03-28 NOTE — ED Provider Notes (Signed)
[facility] DEPT Provider Note   CSN: [id number] Arrival date & time: 03/28/17  1355     History   Chief Complaint Chief Complaint  Patient presents with  . Headache  . Abdominal Pain    HPI Dawn Beard is a 61 y.o. female.  Patient complains of a headache nausea and her family say she is more confused than normal. She has fatty liver and cirrhosis   The history is provided by the patient. No language interpreter was used.  Altered Mental Status   This is a recurrent problem. The current episode started 12 to 24 hours ago. The problem has not changed since onset.Associated symptoms include confusion. Pertinent negatives include no seizures and no hallucinations. Risk factors: hepatic disease. Her past medical history does not include seizures.    Past Medical History:  Diagnosis Date  . Arthritis   . Cirrhosis ([place])    likely NASH  . History of TIAs   . Splenomegaly 11/13/2015  . Stroke [facility])    slight    Patient Active Problem List   Diagnosis Date Noted  . Nausea without vomiting 07/13/2016  . Abdominal pain, epigastric 07/13/2016  . Splenomegaly 11/13/2015  . Pain in the chest 11/25/2014  . Chest pain 11/25/2014  . Encephalopathy, hepatic ([place]) 10/24/2014  . Encounter for screening colonoscopy 06/19/2014  . Hepatic cirrhosis ([place]) 04/25/2014  . Thrombocytopenia ([place]) 04/09/2014  . Sepsis ([place]) 04/09/2014  . Headache 04/09/2014  . Morbid obesity ([place]) 04/09/2014  . Acute pyelonephritis 04/09/2014  . Breast mass, left 04/09/2014  . Fever 04/08/2014    Past Surgical History:  Procedure Laterality Date  . ABDOMINAL HYSTERECTOMY    . APPENDECTOMY    . BIOPSY  08/12/2016   Procedure: BIOPSY;  Surgeon: Daneil Dolin, MD;  Location[facility];  Service: Endoscopy;;  gastric biopsies  . COLONOSCOPY N/A 07/04/2014   Dr. Vivi Ferns  . ESOPHAGOGASTRODUODENOSCOPY N/A 07/04/2014   Dr. Rourk:normal esophagus, query portal gastropathy, chronic  atrophic gastritis with intestinal metaplasia, screening EGD in 2-3 years  . ESOPHAGOGASTRODUODENOSCOPY N/A 08/12/2016   Rourk: erosive gastropathy, bx with atrophic gastritis  . EXTERNAL EAR SURGERY      OB History    Gravida Para Term Preterm AB Living   5 5 5     5    SAB TAB Ectopic Multiple Live Births                   Home Medications    Prior to Admission medications   Medication Sig Start Date End Date Taking? Authorizing Provider  GENERLAC 10 GM/15ML SOLN TAKE 30 TO 45 ML BY MOUTH THREE TIMES DAILY TO FOUR TIMES DAILY TO HAVE 2 TO 3 SPFT BOWEL MOVEMENTS A DAY 03/17/17  Yes Walden Field A, NP  pantoprazole (PROTONIX) 40 MG tablet Take 1 tablet (40 mg total) by mouth daily. 08/19/16  Yes Rourk, Cristopher Estimable, MD  traMADol (ULTRAM) 50 MG tablet Take 100 mg by mouth 4 (four) times daily as needed (break through pain).   Yes [provider]  triamcinolone cream (KENALOG) 0.5 % Apply 1 application topically 2 (two) times daily as needed. 03/18/17  Yes [provider]  XIFAXAN 550 MG TABS tablet TAKE 1 TABLET(550 MG) BY MOUTH TWICE DAILY 01/25/17  Yes Annitta Needs, NP    Family History Family History  Problem Relation Age of Onset  . Liver cancer Mother        ? pancreatic cancer  . Colon  cancer Neg Hx     Social History Social History  Substance Use Topics  . Smoking status: Never Smoker  . Smokeless tobacco: Never Used     Comment: Never smoked  . Alcohol use No     Allergies   Shrimp [shellfish allergy]   Review of Systems Review of Systems  Constitutional: Negative for appetite change and fatigue.  HENT: Negative for congestion, ear discharge and sinus pressure.   Eyes: Negative for discharge.  Respiratory: Negative for cough.   Cardiovascular: Negative for chest pain.  Gastrointestinal: Negative for abdominal pain and diarrhea.       Nauseau  Genitourinary: Negative for frequency and hematuria.  Musculoskeletal: Negative for back pain.  Skin:  Negative for rash.  Neurological: Negative for seizures and headaches.  Psychiatric/Behavioral: Positive for confusion. Negative for hallucinations.     Physical Exam Updated Vital Signs BP 122/65   Pulse 72   Temp 98.1 F (36.7 C) (Oral)   Resp 18   Ht 5\' 5"  (1.651 m)   Wt 99.3 kg (219 lb)   SpO2 97%   BMI 36.44 kg/m   Physical Exam  Constitutional: She appears well-developed.  HENT:  Head: Normocephalic.  Eyes: Conjunctivae and EOM are normal. No scleral icterus.  Neck: Neck supple. No thyromegaly present.  Cardiovascular: Normal rate and regular rhythm.  Exam reveals no gallop and no friction rub.   No murmur heard. Pulmonary/Chest: No stridor. She has no wheezes. She has no rales. She exhibits no tenderness.  Abdominal: She exhibits no distension. There is no tenderness. There is no rebound.  Musculoskeletal: Normal range of motion. She exhibits no edema.  Lymphadenopathy:    She has no cervical adenopathy.  Neurological: She is alert. She exhibits normal muscle tone. Coordination normal.  Oriented to person and place,  But confused over situation  Skin: No rash noted. No erythema.  Psychiatric: She has a normal mood and affect. Her behavior is normal.     ED Treatments / Results  Labs (all labs ordered are listed, but only abnormal results are displayed) Labs Reviewed  COMPREHENSIVE METABOLIC PANEL - Abnormal; Notable for the following:       Result Value   Chloride 117 (*)    Albumin 2.6 (*)    AST 64 (*)    Alkaline Phosphatase 244 (*)    Total Bilirubin 5.0 (*)    All other components within normal limits  AMMONIA - Abnormal; Notable for the following:    Ammonia 121 (*)    All other components within normal limits  CBC WITH DIFFERENTIAL/PLATELET - Abnormal; Notable for the following:    WBC 3.1 (*)    MCV 103.9 (*)    MCH 34.6 (*)    RDW 16.5 (*)    Platelets 51 (*)    All other components within normal limits  LIPASE, BLOOD  URINALYSIS, ROUTINE  W REFLEX MICROSCOPIC    EKG  EKG Interpretation None       Radiology Dg Chest 2 View  Result Date: 03/28/2017 CLINICAL DATA:  61 year old female with shortness of breath, confusion, weakness. Cirrhosis. EXAM: CHEST  2 VIEW COMPARISON:  11/25/2014 and earlier. FINDINGS: Upright AP and lateral views of the chest. Mild cardiomegaly. Other mediastinal contours are within normal limits. Visualized tracheal air column is within normal limits. Interval mildly increased interstitial prominence with basilar predominance. No pneumothorax, pleural effusion or consolidation. Osteopenia. No acute osseous abnormality identified. Negative visible bowel gas pattern. IMPRESSION: 1. Increased pulmonary interstitial  opacity, favor vascular congestion or mild interstitial edema. Viral/atypical respiratory infection fell less likely. 2. Mild cardiomegaly. Electronically Signed   By: Genevie Ann M.D.   On: 03/28/2017 14:36   Ct Renal Stone Study  Result Date: 03/28/2017 CLINICAL DATA:  Abdominal pain, nausea vomiting. EXAM: CT ABDOMEN AND PELVIS WITHOUT CONTRAST TECHNIQUE: Multidetector CT imaging of the abdomen and pelvis was performed following the standard protocol without IV contrast. COMPARISON:  12/24/2015 FINDINGS: Lower chest: No acute abnormality. Hepatobiliary: Nodular contour of the liver. Normal noncontrasted appearance of the gallbladder. Pancreas: Unremarkable. No pancreatic ductal dilatation or surrounding inflammatory changes. Spleen: Enlarged spleen with prominent splenic varices. Adrenals/Urinary Tract: Adrenal glands are unremarkable. Kidneys are without focal lesion, or hydronephrosis. Tiny 2 mm nonobstructive calculus in the lower pole of the left kidney. Mild diffuse thickening of the urinary bladder wall. Small amount of air counter dependently within the urinary bladder. Stomach/Bowel: Stomach is within normal limits. Appendix is not seen. No evidence of bowel wall thickening, distention, or  inflammatory changes. Vascular/Lymphatic: No significant vascular findings are present. No enlarged abdominal or pelvic lymph nodes. Reproductive: Status post hysterectomy. No adnexal masses. Other: Small amount of free fluid in the pelvis. Stable coarse calcifications along the anterior surface of the left psoas muscle. Small fat containing periumbilical hernia. Musculoskeletal: No acute or significant osseous findings. Multilevel osteoarthritic changes. IMPRESSION: No evidence of obstructive uropathy. 2 mm left nonobstructive renal calculus. Diffuse thickening of the urinary bladder wall ; small amount of air within the urinary bladder. If no history of recent instrumentation, consider cystitis with gas-forming organism. Stigmata of cirrhosis with splenomegaly and prominent splenic varices. Small amount of free fluid in the pelvis. Electronically Signed   By: Fidela Salisbury M.D.   On: 03/28/2017 16:00    Procedures Procedures (including critical care time)  Medications Ordered in ED Medications  lactulose (CHRONULAC) 10 GM/15ML solution 45 g (not administered)  ondansetron (ZOFRAN) injection 4 mg (4 mg Intravenous Given 03/28/17 1517)     Initial Impression / Assessment and Plan / ED Course  I have reviewed the triage vital signs and the nursing notes.  Pertinent labs & imaging results that were available during my care of the patient were reviewed by me and considered in my medical decision making (see chart for details).     Patient will be admitted for hepatic encephalopathy. Urinalysis pending she may have a urinary tract infection 2  Final Clinical Impressions(s) / ED Diagnoses   Final diagnoses:  Pain  Confusion    New Prescriptions New Prescriptions   No medications on file     Milton Ferguson, MD 03/28/17 1710

## 2017-03-29 DIAGNOSIS — G9340 Encephalopathy, unspecified: Secondary | ICD-10-CM

## 2017-03-29 DIAGNOSIS — K7460 Unspecified cirrhosis of liver: Secondary | ICD-10-CM | POA: Diagnosis present

## 2017-03-29 DIAGNOSIS — D72819 Decreased white blood cell count, unspecified: Secondary | ICD-10-CM | POA: Diagnosis present

## 2017-03-29 DIAGNOSIS — D7589 Other specified diseases of blood and blood-forming organs: Secondary | ICD-10-CM

## 2017-03-29 DIAGNOSIS — N390 Urinary tract infection, site not specified: Secondary | ICD-10-CM | POA: Diagnosis present

## 2017-03-29 DIAGNOSIS — D696 Thrombocytopenia, unspecified: Secondary | ICD-10-CM

## 2017-03-29 DIAGNOSIS — K7581 Nonalcoholic steatohepatitis (NASH): Secondary | ICD-10-CM

## 2017-03-29 LAB — COMPREHENSIVE METABOLIC PANEL
ALK PHOS: 169 U/L — AB (ref 38–126)
ALT: 30 U/L (ref 14–54)
AST: 51 U/L — ABNORMAL HIGH (ref 15–41)
Albumin: 2 g/dL — ABNORMAL LOW (ref 3.5–5.0)
Anion gap: 4 — ABNORMAL LOW (ref 5–15)
BUN: 12 mg/dL (ref 6–20)
CALCIUM: 9.2 mg/dL (ref 8.9–10.3)
CHLORIDE: 115 mmol/L — AB (ref 101–111)
CO2: 24 mmol/L (ref 22–32)
CREATININE: 0.43 mg/dL — AB (ref 0.44–1.00)
GFR calc Af Amer: >60 mL/min (ref >60)
Glucose, Bld: 74 mg/dL (ref 65–99)
Potassium: 4.2 mmol/L (ref 3.5–5.1)
SODIUM: 143 mmol/L (ref 135–145)
Total Bilirubin: 5 mg/dL — ABNORMAL HIGH (ref 0.3–1.2)
Total Protein: 5.3 g/dL — ABNORMAL LOW (ref 6.5–8.1)

## 2017-03-29 LAB — CBC
HCT: 36.8 % (ref 36.0–46.0)
HEMOGLOBIN: 12.3 g/dL (ref 12.0–15.0)
MCH: 35.1 pg — ABNORMAL HIGH (ref 26.0–34.0)
MCHC: 33.4 g/dL (ref 30.0–36.0)
MCV: 105.1 fL — AB (ref 78.0–100.0)
PLATELETS: 47 10*3/uL — AB (ref 150–400)
RBC: 3.5 MIL/uL — AB (ref 3.87–5.11)
RDW: 16.6 % — ABNORMAL HIGH (ref 11.5–15.5)
WBC: 3 10*3/uL — ABNORMAL LOW (ref 4.0–10.5)

## 2017-03-29 LAB — PROTIME-INR
INR: 2.21
Prothrombin Time: 24.9 seconds — ABNORMAL HIGH (ref 11.4–15.2)

## 2017-03-29 LAB — AMMONIA: Ammonia: 72 umol/L — ABNORMAL HIGH (ref 9–35)

## 2017-03-29 MED ORDER — FUROSEMIDE 10 MG/ML IJ SOLN
10.0000 mg | Freq: Two times a day (BID) | INTRAMUSCULAR | Status: AC
  Administered 2017-03-29 – 2017-03-30 (×2): 10 mg via INTRAVENOUS
  Filled 2017-03-29 (×2): qty 2

## 2017-03-29 MED ORDER — LACTULOSE 10 GM/15ML PO SOLN
30.0000 g | Freq: Three times a day (TID) | ORAL | Status: DC
  Administered 2017-03-29 (×3): 30 g via ORAL
  Filled 2017-03-29 (×2): qty 60

## 2017-03-29 NOTE — Consult Note (Signed)
Referring Provider: Dr. Caryn Section  Primary Care Physician:  Jake Samples, PA-C Primary Gastroenterologist:  Dr. Gala Romney   Date of Admission: 03/28/17 Date of Consultation: 03/29/17  Reason for Consultation:  Encephalopathy   HPI:  Dawn Beard is a 61 y.o. year old female with history of NASH cirrhosis, last EGD in Nov 2017 with erosive gastropathy, atropic gastritis. Colonoscopy normal in Oct 2015. Last seen in our office Jan 2018. Last MELD calculated was 17 in May 2018.  She was to see Dr. Gala Romney as outpatient for follow-up and discuss possible transplant center referral, which was actually scheduled for tomorrow as outpatient. Presented to the ED yesterday with confusion per family. Ammonia elevated at 121 yesterday. Son and husband are at bedside at time of consultation. Found to have UTI.   Son states she has about 1 BM daily.  Lactulose dosing at home but not achieving goal of BMs. Xifaxan BID per outpatient med list. No abdominal pain today. No N/V. Alert and oriented. No overt GI bleeding. Denies fever/chills. Lactulose appears ordered as prn; no BM today.     Past Medical History:  Diagnosis Date  . Arthritis   . Cirrhosis ([place])    likely NASH  . History of TIAs   . Splenomegaly 11/13/2015    Past Surgical History:  Procedure Laterality Date  . ABDOMINAL HYSTERECTOMY    . APPENDECTOMY    . BIOPSY  08/12/2016   Procedure: BIOPSY;  Surgeon: Daneil Dolin, MD;  Location: [facility];  Service: Endoscopy;;  gastric biopsies  . COLONOSCOPY N/A 07/04/2014   Dr. Vivi Ferns  . ESOPHAGOGASTRODUODENOSCOPY N/A 07/04/2014   Dr. Rourk:normal esophagus, query portal gastropathy, chronic atrophic gastritis with intestinal metaplasia, screening EGD in 2-3 years  . ESOPHAGOGASTRODUODENOSCOPY N/A 08/12/2016   Rourk: erosive gastropathy, bx with atrophic gastritis  . EXTERNAL EAR SURGERY      Prior to Admission medications   Medication Sig Start Date End Date Taking?  Authorizing Provider  GENERLAC 10 GM/15ML SOLN TAKE 30 TO 45 ML BY MOUTH THREE TIMES DAILY TO FOUR TIMES DAILY TO HAVE 2 TO 3 SPFT BOWEL MOVEMENTS A DAY 03/17/17  Yes Walden Field A, NP  pantoprazole (PROTONIX) 40 MG tablet Take 1 tablet (40 mg total) by mouth daily. 08/19/16  Yes Rourk, Cristopher Estimable, MD  traMADol (ULTRAM) 50 MG tablet Take 100 mg by mouth 4 (four) times daily as needed (break through pain).   Yes [provider]  triamcinolone cream (KENALOG) 0.5 % Apply 1 application topically 2 (two) times daily as needed. 03/18/17  Yes [provider]  XIFAXAN 550 MG TABS tablet TAKE 1 TABLET(550 MG) BY MOUTH TWICE DAILY 01/25/17  Yes Annitta Needs, NP    Current Facility-Administered Medications  Medication Dose Route Frequency Provider Last Rate Last Dose  . clindamycin (CLEOCIN) IVPB 600 mg  600 mg Intravenous Lynne Logan, MD   Stopped at 03/29/17 [phone]  . lactated ringers infusion   Intravenous Continuous Karmen Bongo, MD 50 mL/hr at 03/28/17 1840    . lactulose (CHRONULAC) 10 GM/15ML solution 30 g  30 g Oral QID PRN Karmen Bongo, MD      . ondansetron [facility]) tablet 4 mg  4 mg Oral Q6H PRN Karmen Bongo, MD       Or  . ondansetron [facility]) injection 4 mg  4 mg Intravenous Q6H PRN Karmen Bongo, MD      . pantoprazole (PROTONIX) EC tablet 40 mg  40 mg Oral Daily  Karmen Bongo, MD   40 mg at 03/29/17 0850  . rifaximin (XIFAXAN) tablet 550 mg  550 mg Oral BID Karmen Bongo, MD   550 mg at 03/29/17 0849  . traMADol (ULTRAM) tablet 50 mg  50 mg Oral QID PRN Karmen Bongo, MD        Allergies as of 03/28/2017 - Review Complete 03/28/2017  Allergen Reaction Noted  . Shrimp [shellfish allergy] Itching and Rash 04/08/2014    Family History  Problem Relation Age of Onset  . Liver cancer Mother        ? pancreatic cancer  . Colon cancer Neg Hx     Social History   Social History  . Marital status: Married    Spouse name: N/A  . Number of children:  N/A  . Years of education: N/A   Occupational History  . unemployed    Social History Main Topics  . Smoking status: Never Smoker  . Smokeless tobacco: Never Used     Comment: Never smoked  . Alcohol use No  . Drug use: No  . Sexual activity: Not on file   Other Topics Concern  . Not on file   Social History Narrative  . No narrative on file    Review of Systems: Gen: Denies fever, chills, loss of appetite, change in weight or weight loss CV: Denies chest pain, heart palpitations, syncope, edema  Resp: Denies shortness of breath with rest, cough, wheezing GI: see HPI  GU : Denies urinary burning, urinary frequency, urinary incontinence.  MS: Denies joint pain,swelling, cramping Derm: Denies rash, itching, dry skin Psych: see HPI  Heme: Denies bruising, bleeding, and enlarged lymph nodes.  Physical Exam: Vital signs in last 24 hours: Temp:  [98.1 F (36.7 C)-98.5 F (36.9 C)] 98.2 F (36.8 C) (07/02 0516) Pulse Rate:  [60-87] 64 (07/02 0516) Resp:  [16-18] 18 (07/02 0516) BP: (99-130)/(43-69) 99/43 (07/02 0516) SpO2:  [96 %-99 %] 97 % (07/02 0516) Weight:  [219 lb (99.3 kg)-221 lb 1.9 oz (100.3 kg)] 221 lb 1.9 oz (100.3 kg) (07/01 1836) Last BM Date: 03/27/17 General:   Alert,  Well-developed, well-nourished, pleasant and cooperative in NAD. Sallow-appearing Head:  Normocephalic and atraumatic. Eyes:  Scleral icterus noted  Ears:  Normal auditory acuity. Nose:  No deformity, discharge,  or lesions. Mouth:  No deformity or lesions, dentition normal. Lungs:  Clear throughout to auscultation.    Heart:  Regular rate and rhythm Abdomen:  Soft, nontender and nondistended. No masses, hepatosplenomegaly or hernias noted. Normal bowel sounds, without guarding, and without rebound.   Rectal:  Deferred  Msk:  Symmetrical without gross deformities. Normal posture. Extremities:  With mild pedal edema  Neurologic:  Alert and  oriented x4; no asterixis  Psych:  Alert and  cooperative. Normal mood and affect.  Intake/Output from previous day: 07/01 0701 - 07/02 0700 In: 476.7 [I.V.:426.7; IV Piggyback:50] Out: -  Intake/Output this shift: No intake/output data recorded.  Lab Results:  Recent Labs  03/28/17 1509 03/29/17 0534  WBC 3.1* 3.0*  HGB 14.1 12.3  HCT 42.4 36.8  PLT 51* 47*   BMET  Recent Labs  03/28/17 1502 03/29/17 0534  NA 145 143  K 4.2 4.2  CL 117* 115*  CO2 22 24  GLUCOSE 94 74  BUN 11 12  CREATININE 0.52 0.43*  CALCIUM 10.1 9.2   LFT  Recent Labs  03/28/17 1502 03/29/17 0534  PROT 6.7 5.3*  ALBUMIN 2.6* 2.0*  AST 64* 51*  ALT 38 30  ALKPHOS 244* 169*  BILITOT 5.0* 5.0*   PT/INR  Recent Labs  03/29/17 0534  LABPROT 24.9*  INR 2.21    Studies/Results: Dg Chest 2 View  Result Date: 03/28/2017 CLINICAL DATA:  61 year old female with shortness of breath, confusion, weakness. Cirrhosis. EXAM: CHEST  2 VIEW COMPARISON:  11/25/2014 and earlier. FINDINGS: Upright AP and lateral views of the chest. Mild cardiomegaly. Other mediastinal contours are within normal limits. Visualized tracheal air column is within normal limits. Interval mildly increased interstitial prominence with basilar predominance. No pneumothorax, pleural effusion or consolidation. Osteopenia. No acute osseous abnormality identified. Negative visible bowel gas pattern. IMPRESSION: 1. Increased pulmonary interstitial opacity, favor vascular congestion or mild interstitial edema. Viral/atypical respiratory infection fell less likely. 2. Mild cardiomegaly. Electronically Signed   By: Genevie Ann M.D.   On: 03/28/2017 14:36   Ct Renal Stone Study  Result Date: 03/28/2017 CLINICAL DATA:  Abdominal pain, nausea vomiting. EXAM: CT ABDOMEN AND PELVIS WITHOUT CONTRAST TECHNIQUE: Multidetector CT imaging of the abdomen and pelvis was performed following the standard protocol without IV contrast. COMPARISON:  12/24/2015 FINDINGS: Lower chest: No acute abnormality.  Hepatobiliary: Nodular contour of the liver. Normal noncontrasted appearance of the gallbladder. Pancreas: Unremarkable. No pancreatic ductal dilatation or surrounding inflammatory changes. Spleen: Enlarged spleen with prominent splenic varices. Adrenals/Urinary Tract: Adrenal glands are unremarkable. Kidneys are without focal lesion, or hydronephrosis. Tiny 2 mm nonobstructive calculus in the lower pole of the left kidney. Mild diffuse thickening of the urinary bladder wall. Small amount of air counter dependently within the urinary bladder. Stomach/Bowel: Stomach is within normal limits. Appendix is not seen. No evidence of bowel wall thickening, distention, or inflammatory changes. Vascular/Lymphatic: No significant vascular findings are present. No enlarged abdominal or pelvic lymph nodes. Reproductive: Status post hysterectomy. No adnexal masses. Other: Small amount of free fluid in the pelvis. Stable coarse calcifications along the anterior surface of the left psoas muscle. Small fat containing periumbilical hernia. Musculoskeletal: No acute or significant osseous findings. Multilevel osteoarthritic changes. IMPRESSION: No evidence of obstructive uropathy. 2 mm left nonobstructive renal calculus. Diffuse thickening of the urinary bladder wall ; small amount of air within the urinary bladder. If no history of recent instrumentation, consider cystitis with gas-forming organism. Stigmata of cirrhosis with splenomegaly and prominent splenic varices. Small amount of free fluid in the pelvis. Electronically Signed   By: Fidela Salisbury M.D.   On: 03/28/2017 16:00    Impression: 61 year old female with NASH cirrhosis, presenting with encephalopathy that is multifactorial in setting of likely inadequate lactulose dosing and UTI. Mental status improved and at baseline without asterixis on exam. I note that her bilirubin continues to climb from baseline several months ago (2-3 range and now 5)and INR is now 2.2,  increased from baseline of 1.4-1.6. MELD Na is 21. Unclear if she would be an appropriate liver transplant candidate. Recommend close follow-up of labs to include INR. Will schedule lactulose dosing to achieve at least 3 soft BMs a day, continue Xifaxan BID. Discussed with family lactulose dosing. Due to language barrier, there may be some miscommunication regarding appropriate dosing. I discussed this with the son and husband at bedside today.   Plan: Scheduled lactulose dosing Continue Xifaxan BID Recheck BMP, INR, CBC, HFP in am Will continue to follow with you  Annitta Needs, PhD, ANP-BC [facility]     LOS: 0 days    03/29/2017, 11:01 AM

## 2017-03-29 NOTE — Progress Notes (Signed)
PROGRESS NOTE    Dawn Beard  ZOX:[id number] DOB: 05-28-1956 DOA: 03/28/2017 PCP: Jake Samples, PA-C  Primary GI-Dr. Gala Romney     Brief Narrative:  Patient is a 61 year old [place] woman, with a history of Karlene Lineman cirrhosis, who presented to the ED on 03/28/2017 with a report of confusion by the family. Apparently, she had been "sick" for 3 days. She had some nausea/vomiting; possibly some abdominal pain.  Reported chills but no fever. She had been sleeping a lot. The family reported that she only had 1-2 bowel movements per day. They denied that she missed lactulose doses. In the ED, she was afebrile and hemodynamically stable. Her lab data were significant for ammonia level of 121, WBC of 3.1, platelets of 51, AST of 64, normal lipase, and a UA that revealed few bacteria and moderate leukocytes. Chest x-ray revealed increased pulmonary vascular opacity favor vascular congestion or mild interstitial edema. Noncontrasted CT revealed 2 mm left nonobstructive renal stone, diffuse thickening of the urinary bladder with small amount of air within the bladder; stigmata of cirrhosis with splenomegaly and prominent splenic varices. She was admitted for hepatic encephalopathy and UTI.   Assessment & Plan:   Principal Problem:   Encephalopathy acute Active Problems:   Acute lower UTI   Thrombocytopenia (HCC)   Morbid obesity (HCC)   Chronic leukopenia   Macrocytosis   1.Acute hepatic encephalopathy. -Patient's ammonia level was 121 on admission, actually better than 175 on 10/13/16. -She had been only having 1-2 bowel movements daily and taking lactulose twice a day. -Dose of lactulose was increased to 3 times a day. Rifampin mean was continued. -Gastroenterology consulted. They agreed with the increase in lactulose dosing. -Ammonia level down to 72. -Patient has no asterixis and her encephalopathy appears to be resolving according to the family.   NASH Cirrhosis causing Coagulopathy,  Pancytopenia, Splenomegaly, Splenic Varices -Followed by Dr. Gala Romney. - MELD-Na score is 21 today per GI. -WBC in the range of 3-3.1. Platelet count in the range of 47-51. Total bilirubin is 5.0. INR is at 2.2. No signs of bleeding. -The patient was continued on rifaximin. -Further GI recommendations appreciated. -We'll order TSH and vitamin B12 level for further evaluation.   UTI -Patient's urinalysis was consistent with infection. She endorsed dysuria. Noncontrasted CT revealed diffuse thickening of the urinary bladder wall with a small amount of air within the urinary bladder-Query recent instrumentation versus cystitis with gas-forming organism. -Reviewed the ED orders and in and out catheter was ordered, but was discontinued. Although there is a language barrier, the patient denied having a catheter inserted when asked via her husband and son. -The air seen is likely from in and out catheterization, but this has not been confirmed.Therefore, will continue clindamycin until the urine culture has been resulted.   Mild pulmonary edema, per chest x-ray.  This is likely from cirrhosis Patient does not appear to be on diuretic therapy. -We'll give two 10 mg dose of IV Lasix in the setting of soft blood pressures. We'll KVO IV fluids. -Consider oral diuretic, but will defer to GI.    DVT prophylaxis: SCDs Code Status: full code Family Communication: discuss with husband and son Disposition Plan: likely discharge in a couple of days.   Consultants:    Gastroenterology  Procedures:   None  Antimicrobials:    Clindamycin 7/1>>    Subjective:   (Husband and son provide interpretation with consent.). Patient denies headache, dizziness, chest pain, shortness of breath, or abdominal pain. No  bowel movement this morning so far. She does endorse some discomfort with urination.  Objective: Vitals:   03/28/17 1800 03/28/17 1836 03/28/17 2157 03/29/17 0516  BP: 124/68 (!) 124/55 (!)  110/46 (!) 99/43  Pulse: 75 68 60 64  Resp:  16 18 18   Temp:  98.5 F (36.9 C) 98.4 F (36.9 C) 98.2 F (36.8 C)  TempSrc:  Oral Oral Oral  SpO2: 99% 99% 99% 97%  Weight:  100.3 kg (221 lb 1.9 oz)    Height:  5\' 1"  (1.549 m)      Intake/Output Summary (Last 24 hours) at 03/29/17 1256 Last data filed at 03/29/17 1093  Gross per 24 hour  Intake           476.67 ml  Output                0 ml  Net           476.67 ml   Filed Weights   03/28/17 1403 03/28/17 1836  Weight: 99.3 kg (219 lb) 100.3 kg (221 lb 1.9 oz)    Examination:  General exam: Appears calm and comfortable  Respiratory system: Clear to auscultation. Respiratory effort normal. Cardiovascular system: S1 & S2 heard, RRR. No JVD, murmurs, rubs, gallops or clicks. No pedal edema. Gastrointestinal system: Abdomen is nondistended, soft and mildly tender over the bladder. No organomegaly or masses felt. Normal bowel sounds heard. Central nervous system: Alert and oriented. No focal neurological deficits. No asterixis. Extremities:  No acute hot red joints. Skin: No rashes, lesions or ulcers Psychiatry: Judgement and insight appear normal. Mood & affect appropriate.     Data Reviewed: I have personally reviewed following labs and imaging studies  CBC:  Recent Labs Lab 03/28/17 1509 03/29/17 0534  WBC 3.1* 3.0*  NEUTROABS 2.0  --   HGB 14.1 12.3  HCT 42.4 36.8  MCV 103.9* 105.1*  PLT 51* 47*   Basic Metabolic Panel:  Recent Labs Lab 03/28/17 1502 03/29/17 0534  NA 145 143  K 4.2 4.2  CL 117* 115*  CO2 22 24  GLUCOSE 94 74  BUN 11 12  CREATININE 0.52 0.43*  CALCIUM 10.1 9.2   GFR: Estimated Creatinine Clearance: 80.2 mL/min (A) (by C-G formula based on SCr of 0.43 mg/dL (L)). Liver Function Tests:  Recent Labs Lab 03/28/17 1502 03/29/17 0534  AST 64* 51*  ALT 38 30  ALKPHOS 244* 169*  BILITOT 5.0* 5.0*  PROT 6.7 5.3*  ALBUMIN 2.6* 2.0*    Recent Labs Lab 03/28/17 1502  LIPASE 44      Recent Labs Lab 03/28/17 1502  AMMONIA 121*   Coagulation Profile:  Recent Labs Lab 03/29/17 0534  INR 2.21   Cardiac Enzymes: No results for input(s): CKTOTAL, CKMB, CKMBINDEX, TROPONINI in the last 168 hours. BNP (last 3 results) No results for input(s): PROBNP in the last 8760 hours. HbA1C: No results for input(s): HGBA1C in the last 72 hours. CBG: No results for input(s): GLUCAP in the last 168 hours. Lipid Profile: No results for input(s): CHOL, HDL, LDLCALC, TRIG, CHOLHDL, LDLDIRECT in the last 72 hours. Thyroid Function Tests: No results for input(s): TSH, T4TOTAL, FREET4, T3FREE, THYROIDAB in the last 72 hours. Anemia Panel: No results for input(s): VITAMINB12, FOLATE, FERRITIN, TIBC, IRON, RETICCTPCT in the last 72 hours. Sepsis Labs: No results for input(s): PROCALCITON, LATICACIDVEN in the last 168 hours.  No results found for this or any previous visit (from the past 240 hour(s)).  Radiology Studies: Dg Chest 2 View  Result Date: 03/28/2017 CLINICAL DATA:  61 year old female with shortness of breath, confusion, weakness. Cirrhosis. EXAM: CHEST  2 VIEW COMPARISON:  11/25/2014 and earlier. FINDINGS: Upright AP and lateral views of the chest. Mild cardiomegaly. Other mediastinal contours are within normal limits. Visualized tracheal air column is within normal limits. Interval mildly increased interstitial prominence with basilar predominance. No pneumothorax, pleural effusion or consolidation. Osteopenia. No acute osseous abnormality identified. Negative visible bowel gas pattern. IMPRESSION: 1. Increased pulmonary interstitial opacity, favor vascular congestion or mild interstitial edema. Viral/atypical respiratory infection fell less likely. 2. Mild cardiomegaly. Electronically Signed   By: Genevie Ann M.D.   On: 03/28/2017 14:36   Ct Renal Stone Study  Result Date: 03/28/2017 CLINICAL DATA:  Abdominal pain, nausea vomiting. EXAM: CT ABDOMEN AND PELVIS  WITHOUT CONTRAST TECHNIQUE: Multidetector CT imaging of the abdomen and pelvis was performed following the standard protocol without IV contrast. COMPARISON:  12/24/2015 FINDINGS: Lower chest: No acute abnormality. Hepatobiliary: Nodular contour of the liver. Normal noncontrasted appearance of the gallbladder. Pancreas: Unremarkable. No pancreatic ductal dilatation or surrounding inflammatory changes. Spleen: Enlarged spleen with prominent splenic varices. Adrenals/Urinary Tract: Adrenal glands are unremarkable. Kidneys are without focal lesion, or hydronephrosis. Tiny 2 mm nonobstructive calculus in the lower pole of the left kidney. Mild diffuse thickening of the urinary bladder wall. Small amount of air counter dependently within the urinary bladder. Stomach/Bowel: Stomach is within normal limits. Appendix is not seen. No evidence of bowel wall thickening, distention, or inflammatory changes. Vascular/Lymphatic: No significant vascular findings are present. No enlarged abdominal or pelvic lymph nodes. Reproductive: Status post hysterectomy. No adnexal masses. Other: Small amount of free fluid in the pelvis. Stable coarse calcifications along the anterior surface of the left psoas muscle. Small fat containing periumbilical hernia. Musculoskeletal: No acute or significant osseous findings. Multilevel osteoarthritic changes. IMPRESSION: No evidence of obstructive uropathy. 2 mm left nonobstructive renal calculus. Diffuse thickening of the urinary bladder wall ; small amount of air within the urinary bladder. If no history of recent instrumentation, consider cystitis with gas-forming organism. Stigmata of cirrhosis with splenomegaly and prominent splenic varices. Small amount of free fluid in the pelvis. Electronically Signed   By: Fidela Salisbury M.D.   On: 03/28/2017 16:00        Scheduled Meds: . lactulose  30 g Oral TID  . pantoprazole  40 mg Oral Daily  . rifaximin  550 mg Oral BID   Continuous  Infusions: . clindamycin (CLEOCIN) IV Stopped (03/29/17 0529)  . lactated ringers 50 mL/hr at 03/28/17 1840     LOS: 0 days    Time spent:  28 minutes    Rexene Alberts, MD [facility] Pager [phone]  If 7PM-7AM, please contact night-coverage [email or web address] Password [facility] 03/29/2017, 12:56 PM

## 2017-03-30 ENCOUNTER — Telehealth: Payer: Self-pay | Admitting: Gastroenterology

## 2017-03-30 ENCOUNTER — Ambulatory Visit: Payer: BLUE CROSS/BLUE SHIELD | Admitting: Internal Medicine

## 2017-03-30 ENCOUNTER — Encounter: Payer: Self-pay | Admitting: Gastroenterology

## 2017-03-30 DIAGNOSIS — K7460 Unspecified cirrhosis of liver: Secondary | ICD-10-CM

## 2017-03-30 DIAGNOSIS — K7581 Nonalcoholic steatohepatitis (NASH): Secondary | ICD-10-CM

## 2017-03-30 DIAGNOSIS — N390 Urinary tract infection, site not specified: Secondary | ICD-10-CM

## 2017-03-30 LAB — HEPATIC FUNCTION PANEL
ALBUMIN: 2 g/dL — AB (ref 3.5–5.0)
ALT: 30 U/L (ref 14–54)
AST: 59 U/L — ABNORMAL HIGH (ref 15–41)
Alkaline Phosphatase: 182 U/L — ABNORMAL HIGH (ref 38–126)
BILIRUBIN DIRECT: 1 mg/dL — AB (ref 0.1–0.5)
Indirect Bilirubin: 2.7 mg/dL — ABNORMAL HIGH (ref 0.3–0.9)
Total Bilirubin: 3.7 mg/dL — ABNORMAL HIGH (ref 0.3–1.2)
Total Protein: 5.3 g/dL — ABNORMAL LOW (ref 6.5–8.1)

## 2017-03-30 LAB — CBC WITH DIFFERENTIAL/PLATELET
BASOS ABS: 0 10*3/uL (ref 0.0–0.1)
BASOS PCT: 1 %
EOS PCT: 9 %
Eosinophils Absolute: 0.3 10*3/uL (ref 0.0–0.7)
HCT: 37 % (ref 36.0–46.0)
Hemoglobin: 12.4 g/dL (ref 12.0–15.0)
Lymphocytes Relative: 35 %
Lymphs Abs: 1.2 10*3/uL (ref 0.7–4.0)
MCH: 34.9 pg — ABNORMAL HIGH (ref 26.0–34.0)
MCHC: 33.5 g/dL (ref 30.0–36.0)
MCV: 104.2 fL — AB (ref 78.0–100.0)
MONO ABS: 0.4 10*3/uL (ref 0.1–1.0)
Monocytes Relative: 12 %
Neutro Abs: 1.4 10*3/uL — ABNORMAL LOW (ref 1.7–7.7)
Neutrophils Relative %: 43 %
PLATELETS: 47 10*3/uL — AB (ref 150–400)
RBC: 3.55 MIL/uL — ABNORMAL LOW (ref 3.87–5.11)
RDW: 16.4 % — AB (ref 11.5–15.5)
WBC: 3.3 10*3/uL — ABNORMAL LOW (ref 4.0–10.5)

## 2017-03-30 LAB — PROTIME-INR
INR: 1.85
PROTHROMBIN TIME: 21.6 s — AB (ref 11.4–15.2)

## 2017-03-30 LAB — BASIC METABOLIC PANEL
ANION GAP: 6 (ref 5–15)
BUN: 11 mg/dL (ref 6–20)
CALCIUM: 9.1 mg/dL (ref 8.9–10.3)
CO2: 24 mmol/L (ref 22–32)
Chloride: 109 mmol/L (ref 101–111)
Creatinine, Ser: 0.56 mg/dL (ref 0.44–1.00)
GFR calc non Af Amer: >60 mL/min (ref >60)
Glucose, Bld: 95 mg/dL (ref 65–99)
Potassium: 3.7 mmol/L (ref 3.5–5.1)
SODIUM: 139 mmol/L (ref 135–145)

## 2017-03-30 LAB — TSH: TSH: 1.718 u[IU]/mL (ref 0.350–4.500)

## 2017-03-30 LAB — HIV ANTIBODY (ROUTINE TESTING W REFLEX): HIV Screen 4th Generation wRfx: NONREACTIVE

## 2017-03-30 LAB — VITAMIN B12: VITAMIN B 12: 934 pg/mL — AB (ref 180–914)

## 2017-03-30 LAB — AMMONIA: AMMONIA: 72 umol/L — AB (ref 9–35)

## 2017-03-30 MED ORDER — LACTULOSE 10 GM/15ML PO SOLN
30.0000 g | Freq: Three times a day (TID) | ORAL | Status: DC
  Administered 2017-03-30: 30 g via ORAL
  Filled 2017-03-30: qty 60

## 2017-03-30 MED ORDER — RIFAXIMIN 550 MG PO TABS: 550.0000 mg | ORAL_TABLET | Freq: Two times a day (BID) | ORAL | 2 refills | Status: DC

## 2017-03-30 MED ORDER — LACTULOSE 10 GM/15ML PO SOLN: 30.0000 g | Freq: Every day | ORAL | Status: DC | PRN

## 2017-03-30 MED ORDER — CEFTRIAXONE SODIUM 1 G IJ SOLR
1.0000 g | INTRAMUSCULAR | Status: DC
  Filled 2017-03-30 (×2): qty 10

## 2017-03-30 MED ORDER — PANTOPRAZOLE SODIUM 40 MG PO TBEC: 40.0000 mg | DELAYED_RELEASE_TABLET | Freq: Every day | ORAL | 11 refills | Status: AC

## 2017-03-30 MED ORDER — RIFAXIMIN 550 MG PO TABS: 550.0000 mg | ORAL_TABLET | Freq: Two times a day (BID) | ORAL | 2 refills | Status: AC

## 2017-03-30 MED ORDER — LACTULOSE 10 GM/15ML PO SOLN: 30.0000 g | Freq: Three times a day (TID) | ORAL | 0 refills | Status: AC

## 2017-03-30 NOTE — Telephone Encounter (Signed)
Needs office visit in 2-4 weeks for hospital follow-up.

## 2017-03-30 NOTE — Progress Notes (Signed)
Pt helped to the bathroom and back to bed by husband. Call light within reach, iv infusing nothing needed at this time.

## 2017-03-30 NOTE — Discharge Summary (Signed)
Physician Discharge Summary  Dawn Beard WFU:[id number] DOB: [birth year] DOA: 03/28/2017  PCP: Dawn Samples, PA-C  Admit date: 03/28/2017 Discharge date: 03/30/2017  Time spent: 45 minutes  Recommendations for Outpatient Follow-up:  -Will be discharged home today. -Advised to follow-up with primary care provider and GI as scheduled.   Discharge Diagnoses:  Principal Problem:   Encephalopathy acute Active Problems:   Thrombocytopenia ([place])   Morbid obesity ([place])   Acute lower UTI   Chronic leukopenia   Macrocytosis   Liver cirrhosis secondary to NASH [facility])   Discharge Condition: Stable and improved  Filed Weights   03/28/17 1403 03/28/17 1836  Weight: 99.3 kg (219 lb) 100.3 kg (221 lb 1.9 oz)    History of present illness:  As per Dr. Lorin Mercy 7/1: Dawn Beard is a 61 y.o. female with medical history significant of cirrhosis from NASH presenting with AMS.  She is solely Spanish-speaking and history was obtained with her son translating.  He reports that she has been "sick" x 3 days.  N/V, confusion.  This is how she usually presents when her liver is acting up (3 years).  +headache - occipital.   Abdominal pain.  Chills but no fever.  Sleeping all the time.  Feels dizzy.  Mouth was drooping this AM.  +dysuria.  No blood in her urine.     ED Course: Hepatic encephalopathy - given Lactulose and Zofran.  UA pending.  Hospital Course:   Hepatic encephalopathy/[place] cirrhosis with coagulopathy, pancytopenia, splenomegaly, splenic varices -Patient's ammonia level was 121 on admission. -I believe she did not understand instructions for lactulose administration. -Lactulose has been increased to 3 times daily with cold achieving 3 soft BMs a day. -Rifaximin was continued -Ammonia is down to 72 on discharge but she is completely oriented and not confused and son states she is at baseline.  UTI -Cx with >100,000 GNR. -Has received 3 days of abx. -Given  lack of symptoms, will not prescribe further abx on DC.  Procedures:  None   Consultations:  GI  Discharge Instructions  Discharge Instructions    Increase activity slowly    Complete by:  As directed      Allergies as of 03/30/2017      Reactions   Shrimp [shellfish Allergy] Itching, Rash      Medication List    STOP taking these medications   GENERLAC 10 GM/15ML Soln Generic drug:  lactulose (encephalopathy)     TAKE these medications   lactulose 10 GM/15ML solution Commonly known as:  CHRONULAC Take 45 mLs (30 g total) by mouth 3 (three) times daily. Tome 45 cc 3 veces al dia   pantoprazole 40 MG tablet Commonly known as:  PROTONIX Take 1 tablet (40 mg total) by mouth daily. [place] What changed:  additional instructions   rifaximin 550 MG Tabs tablet Commonly known as:  XIFAXAN Take 1 tablet (550 mg total) by mouth 2 (two) times daily. Tome 1 tableta dos veces al dia What changed:  See the new instructions.   traMADol 50 MG tablet Commonly known as:  ULTRAM Take 100 mg by mouth 4 (four) times daily as needed (break through pain).   triamcinolone cream 0.5 % Commonly known as:  KENALOG Apply 1 application topically 2 (two) times daily as needed.      Allergies  Allergen Reactions  . Shrimp [Shellfish Allergy] Itching and Rash   Follow-up Information    Dawn Samples, PA-C  In 2 weeks.   Specialty:  Family Medicine Contact information: [address] [place] [address] [phone]            The results of significant diagnostics from this hospitalization (including imaging, microbiology, ancillary and laboratory) are listed below for reference.    Significant Diagnostic Studies: Dg Chest 2 View  Result Date: 03/28/2017 CLINICAL DATA:  61 year old female with shortness of breath, confusion, weakness. Cirrhosis. EXAM: CHEST  2 VIEW COMPARISON:  11/25/2014 and earlier. FINDINGS: Upright AP and lateral views of the  chest. Mild cardiomegaly. Other mediastinal contours are within normal limits. Visualized tracheal air column is within normal limits. Interval mildly increased interstitial prominence with basilar predominance. No pneumothorax, pleural effusion or consolidation. Osteopenia. No acute osseous abnormality identified. Negative visible bowel gas pattern. IMPRESSION: 1. Increased pulmonary interstitial opacity, favor vascular congestion or mild interstitial edema. Viral/atypical respiratory infection fell less likely. 2. Mild cardiomegaly. Electronically Signed   By: Genevie Ann M.D.   On: 03/28/2017 14:36   Ct Renal Stone Study  Result Date: 03/28/2017 CLINICAL DATA:  Abdominal pain, nausea vomiting. EXAM: CT ABDOMEN AND PELVIS WITHOUT CONTRAST TECHNIQUE: Multidetector CT imaging of the abdomen and pelvis was performed following the standard protocol without IV contrast. COMPARISON:  12/24/2015 FINDINGS: Lower chest: No acute abnormality. Hepatobiliary: Nodular contour of the liver. Normal noncontrasted appearance of the gallbladder. Pancreas: Unremarkable. No pancreatic ductal dilatation or surrounding inflammatory changes. Spleen: Enlarged spleen with prominent splenic varices. Adrenals/Urinary Tract: Adrenal glands are unremarkable. Kidneys are without focal lesion, or hydronephrosis. Tiny 2 mm nonobstructive calculus in the lower pole of the left kidney. Mild diffuse thickening of the urinary bladder wall. Small amount of air counter dependently within the urinary bladder. Stomach/Bowel: Stomach is within normal limits. Appendix is not seen. No evidence of bowel wall thickening, distention, or inflammatory changes. Vascular/Lymphatic: No significant vascular findings are present. No enlarged abdominal or pelvic lymph nodes. Reproductive: Status post hysterectomy. No adnexal masses. Other: Small amount of free fluid in the pelvis. Stable coarse calcifications along the anterior surface of the left psoas muscle. Small  fat containing periumbilical hernia. Musculoskeletal: No acute or significant osseous findings. Multilevel osteoarthritic changes. IMPRESSION: No evidence of obstructive uropathy. 2 mm left nonobstructive renal calculus. Diffuse thickening of the urinary bladder wall ; small amount of air within the urinary bladder. If no history of recent instrumentation, consider cystitis with gas-forming organism. Stigmata of cirrhosis with splenomegaly and prominent splenic varices. Small amount of free fluid in the pelvis. Electronically Signed   By: Fidela Salisbury M.D.   On: 03/28/2017 16:00    Microbiology: Recent Results (from the past 240 hour(s))  Urine culture     Status: Abnormal (Preliminary result)   Collection Time: 03/28/17  2:07 PM  Result Value Ref Range Status   Specimen Description URINE, CLEAN CATCH  Final   Special Requests NONE  Final   Culture (A)  Final    >=100,000 COLONIES/mL ESCHERICHIA COLI SUSCEPTIBILITIES TO FOLLOW Performed at [facility], [address] [place], [place] [address]    Report Status PENDING  Incomplete     Labs: Basic Metabolic Panel:  Recent Labs Lab 03/28/17 1502 03/29/17 0534 03/30/17 0412  NA 145 143 139  K 4.2 4.2 3.7  CL 117* 115* 109  CO2 22 24 24   GLUCOSE 94 74 95  BUN 11 12 11   CREATININE 0.52 0.43* 0.56  CALCIUM 10.1 9.2 9.1   Liver Function Tests:  Recent Labs Lab  03/28/17 1502 03/29/17 0534 03/30/17 0412  AST 64* 51* 59*  ALT 38 30 30  ALKPHOS 244* 169* 182*  BILITOT 5.0* 5.0* 3.7*  PROT 6.7 5.3* 5.3*  ALBUMIN 2.6* 2.0* 2.0*    Recent Labs Lab 03/28/17 1502  LIPASE 44    Recent Labs Lab 03/28/17 1502 03/29/17 1230 03/30/17 0412  AMMONIA 121* 72* 72*   CBC:  Recent Labs Lab 03/28/17 1509 03/29/17 0534 03/30/17 0412  WBC 3.1* 3.0* 3.3*  NEUTROABS 2.0  --  1.4*  HGB 14.1 12.3 12.4  HCT 42.4 36.8 37.0  MCV 103.9* 105.1* 104.2*  PLT 51* 47* 47*   Cardiac Enzymes: No results for input(s):  CKTOTAL, CKMB, CKMBINDEX, TROPONINI in the last 168 hours. BNP: BNP (last 3 results) No results for input(s): BNP in the last 8760 hours.  ProBNP (last 3 results) No results for input(s): PROBNP in the last 8760 hours.  CBG: No results for input(s): GLUCAP in the last 168 hours.     SignedLelon Frohlich  [facility] Pager: [phone] 03/30/2017, 2:29 PM

## 2017-03-30 NOTE — Progress Notes (Signed)
Pharmacy Antibiotic Note  Dawn Beard is a 61 y.o. female admitted on 03/28/2017 with UTI.  Pharmacy has been consulted for ceftriaxone dosing.  Plan: Ceftriaxone 1gm IV q24h F/U cxs and clinical progress Monitor V/S and labs  Height: 5\' 1"  (154.9 cm) Weight: 221 lb 1.9 oz (100.3 kg) IBW/kg (Calculated) : 47.8  Temp (24hrs), Avg:98.2 F (36.8 C), Min:97.7 F (36.5 C), Max:98.6 F (37 C)   Recent Labs Lab 03/28/17 1502 03/28/17 1509 03/29/17 0534 03/30/17 0412  WBC  --  3.1* 3.0* 3.3*  CREATININE 0.52  --  0.43* 0.56    Estimated Creatinine Clearance: 80.2 mL/min (by C-G formula based on SCr of 0.56 mg/dL).    Allergies  Allergen Reactions  . Shrimp [Shellfish Allergy] Itching and Rash    Antimicrobials this admission: Ceftriaxone 7/3 >>   Microbiology results: 7/1 UCx: >100,000 CFU/ml GNR  Thank you for allowing pharmacy to be a part of this patient's care.  Isac Sarna, BS Pharm D, BCPS Clinical Pharmacist Pager [phone] 03/30/2017 11:13 AM

## 2017-03-30 NOTE — Progress Notes (Signed)
REVIEWED-NO ADDITIONAL RECOMMENDATIONS.   Subjective: 2 BMs yesterday, soft. No confusion. No abdominal pain, N/V. Desires to go home soon. Son at bedside.   Objective: Vital signs in last 24 hours: Temp:  [97.7 F (36.5 C)-98.6 F (37 C)] 97.7 F (36.5 C) (07/03 0555) Pulse Rate:  [62-64] 63 (07/03 0555) Resp:  [18] 18 (07/03 0555) BP: (96-118)/(44-46) 96/44 (07/03 0555) SpO2:  [97 %-99 %] 99 % (07/03 0555) Last BM Date: 03/27/17 General:   Alert and oriented, pleasant Eyes:  Mild scleral icterus  Abdomen:  Bowel sounds present, soft, obese, non-tender, non-distended.  Extremities:  Without  edema. Neurologic:  Alert and  oriented x4; negative asterixis  Psych:  Alert and cooperative. Normal mood and affect.  Intake/Output from previous day: 07/02 0701 - 07/03 0700 In: 720 [P.O.:720] Out: -  Intake/Output this shift: No intake/output data recorded.  Lab Results:  Recent Labs  03/28/17 1509 03/29/17 0534 03/30/17 0412  WBC 3.1* 3.0* 3.3*  HGB 14.1 12.3 12.4  HCT 42.4 36.8 37.0  PLT 51* 47* 47*   BMET  Recent Labs  03/28/17 1502 03/29/17 0534 03/30/17 0412  NA 145 143 139  K 4.2 4.2 3.7  CL 117* 115* 109  CO2 22 24 24   GLUCOSE 94 74 95  BUN 11 12 11   CREATININE 0.52 0.43* 0.56  CALCIUM 10.1 9.2 9.1   LFT  Recent Labs  03/28/17 1502 03/29/17 0534 03/30/17 0412  PROT 6.7 5.3* 5.3*  ALBUMIN 2.6* 2.0* 2.0*  AST 64* 51* 59*  ALT 38 30 30  ALKPHOS 244* 169* 182*  BILITOT 5.0* 5.0* 3.7*  BILIDIR  --   --  1.0*  IBILI  --   --  2.7*   PT/INR  Recent Labs  03/29/17 0534 03/30/17 0412  LABPROT 24.9* 21.6*  INR 2.21 1.85    Studies/Results: Dg Chest 2 View  Result Date: 03/28/2017 CLINICAL DATA:  61 year old female with shortness of breath, confusion, weakness. Cirrhosis. EXAM: CHEST  2 VIEW COMPARISON:  11/25/2014 and earlier. FINDINGS: Upright AP and lateral views of the chest. Mild cardiomegaly. Other mediastinal contours are within normal  limits. Visualized tracheal air column is within normal limits. Interval mildly increased interstitial prominence with basilar predominance. No pneumothorax, pleural effusion or consolidation. Osteopenia. No acute osseous abnormality identified. Negative visible bowel gas pattern. IMPRESSION: 1. Increased pulmonary interstitial opacity, favor vascular congestion or mild interstitial edema. Viral/atypical respiratory infection fell less likely. 2. Mild cardiomegaly. Electronically Signed   By: Genevie Ann M.D.   On: 03/28/2017 14:36   Ct Renal Stone Study  Result Date: 03/28/2017 CLINICAL DATA:  Abdominal pain, nausea vomiting. EXAM: CT ABDOMEN AND PELVIS WITHOUT CONTRAST TECHNIQUE: Multidetector CT imaging of the abdomen and pelvis was performed following the standard protocol without IV contrast. COMPARISON:  12/24/2015 FINDINGS: Lower chest: No acute abnormality. Hepatobiliary: Nodular contour of the liver. Normal noncontrasted appearance of the gallbladder. Pancreas: Unremarkable. No pancreatic ductal dilatation or surrounding inflammatory changes. Spleen: Enlarged spleen with prominent splenic varices. Adrenals/Urinary Tract: Adrenal glands are unremarkable. Kidneys are without focal lesion, or hydronephrosis. Tiny 2 mm nonobstructive calculus in the lower pole of the left kidney. Mild diffuse thickening of the urinary bladder wall. Small amount of air counter dependently within the urinary bladder. Stomach/Bowel: Stomach is within normal limits. Appendix is not seen. No evidence of bowel wall thickening, distention, or inflammatory changes. Vascular/Lymphatic: No significant vascular findings are present. No enlarged abdominal or pelvic lymph nodes. Reproductive: Status post hysterectomy. No  adnexal masses. Other: Small amount of free fluid in the pelvis. Stable coarse calcifications along the anterior surface of the left psoas muscle. Small fat containing periumbilical hernia. Musculoskeletal: No acute or  significant osseous findings. Multilevel osteoarthritic changes. IMPRESSION: No evidence of obstructive uropathy. 2 mm left nonobstructive renal calculus. Diffuse thickening of the urinary bladder wall ; small amount of air within the urinary bladder. If no history of recent instrumentation, consider cystitis with gas-forming organism. Stigmata of cirrhosis with splenomegaly and prominent splenic varices. Small amount of free fluid in the pelvis. Electronically Signed   By: Fidela Salisbury M.D.   On: 03/28/2017 16:00    Assessment: 61 year old female with NASH cirrhosis, presenting with encephalopathy that is multifactorial in setting of likely inadequate lactulose dosing as outpatient and UTI. Mental status improved and at baseline without asterixis on exam.  MELD Na 21 yesterday, 19 today. Unclear if she would be an appropriate liver transplant candidate. Recommend close follow-up of labs to include INR.   Two soft BMs yesterday. Lactulose is scheduled TID, and I have ordered a prn dosing if not achieving 3 soft BMs daily. Continue Xifaxan BID. Discussed with family lactulose dosing again today.   Plan: Lactulose dosing to achieve 3 soft BMs daily Xifaxan BID Change diet to 2 gram sodium  Close outpatient follow-up Follow CMP, INR Improved from a GI standpoint: will arrange follow-up as outpatient for further cirrhosis care   Annitta Needs, PhD, ANP-BC [facility]    LOS: 1 day    03/30/2017, 8:03 AM

## 2017-03-30 NOTE — Care Management Note (Signed)
Case Management Note  Patient Details  Name: Dawn Beard MRN: [id number] Date of Birth: [birth year]  Subjective/Objective:                  Pt admitted with AMS ?r/t UTI vs cirrhosis. No  CM consult, chart reviewed for CM needs prior to DC. Pt from home, lives with son who is very involved in care. Pt has PCP and insurance with drug coverage. Pt has no HH or DME needs at this time.   Action/Plan: DC home today with self care and GI/PCP follow up.    Expected Discharge Date:  03/30/17               Expected Discharge Plan:  Home/Self Care  In-House Referral:  NA  Discharge planning Services  CM Consult  Post Acute Care Choice:  NA Choice offered to:  NA  Status of Service:  Completed, signed off  Sherald Barge, RN 03/30/2017, 11:27 AM

## 2017-03-30 NOTE — Telephone Encounter (Signed)
APPT MADE AND LETTER SENT  °

## 2017-03-30 NOTE — Progress Notes (Signed)
Discharge instructions reviewed with patients son He read the spanish I read the [facility]. Pt and her son verbalized understanding of all instructions. Discharged to home with son.

## 2017-03-31 LAB — URINE CULTURE

## 2017-04-12 ENCOUNTER — Ambulatory Visit: Payer: BLUE CROSS/BLUE SHIELD | Admitting: Gastroenterology

## 2017-05-27 ENCOUNTER — Ambulatory Visit: Payer: BLUE CROSS/BLUE SHIELD | Admitting: Gastroenterology

## 2017-05-27 ENCOUNTER — Encounter: Payer: Self-pay | Admitting: Gastroenterology

## 2017-05-27 ENCOUNTER — Telehealth: Payer: Self-pay | Admitting: Gastroenterology

## 2017-05-27 NOTE — Telephone Encounter (Signed)
Pt was a no show and letter sent  °

## 2018-09-20 IMAGING — US US ABDOMEN COMPLETE
1 series · 14 of 25 positions shown · non-contrast
Comparison: CT 12/24/2015.

CLINICAL DATA: Cirrhosis.  Abdominal pain .

EXAM:
ABDOMEN ULTRASOUND COMPLETE

[Series 1: us abdomen complete · 0.25mm/px · 14 of 101 slices shown]
[im 1/101]
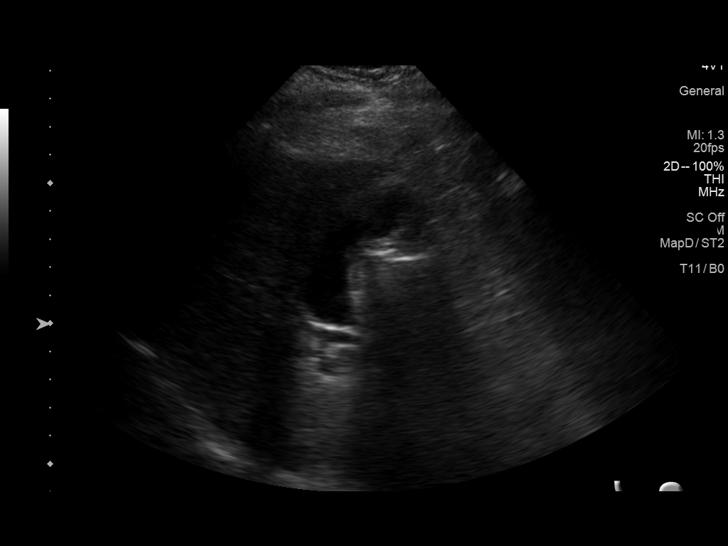
[im 9/101]
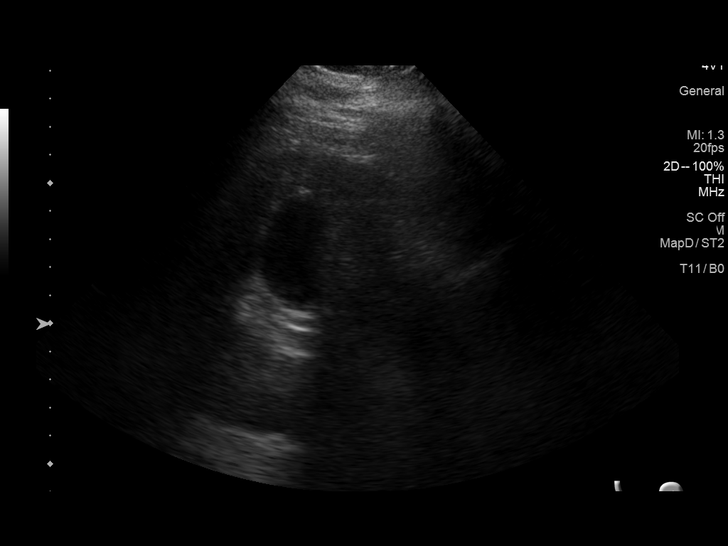
[im 17/101]
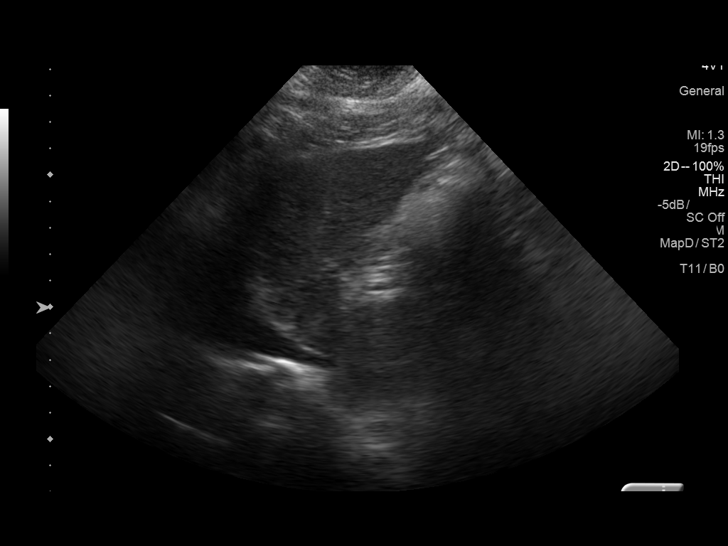
[im 26/101]
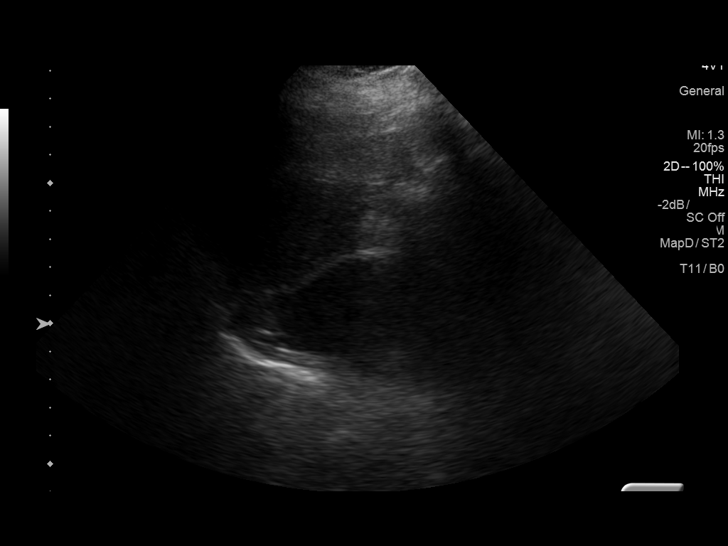
[im 34/101]
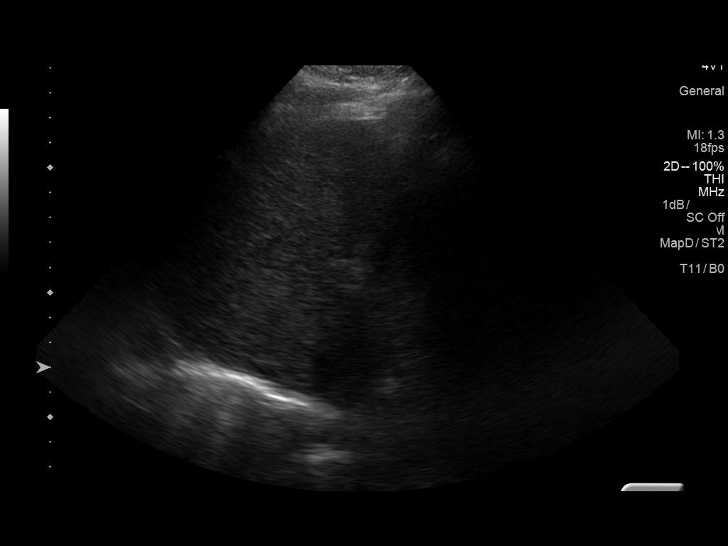
[im 38/101]
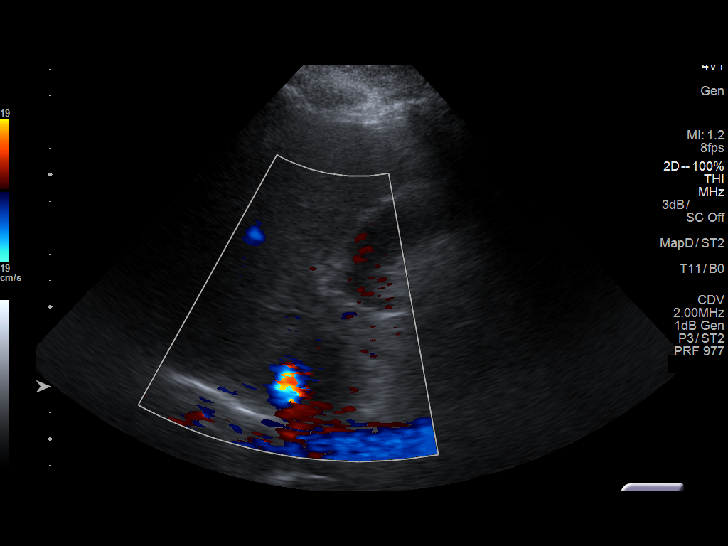
[im 46/101]
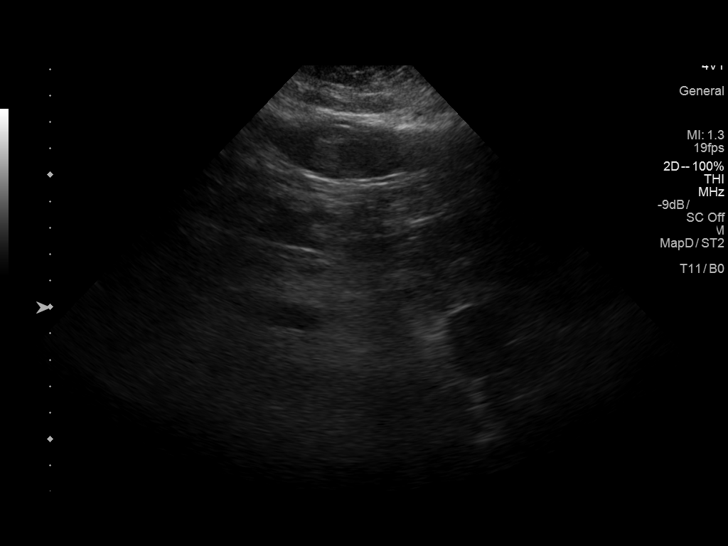
[im 55/101]
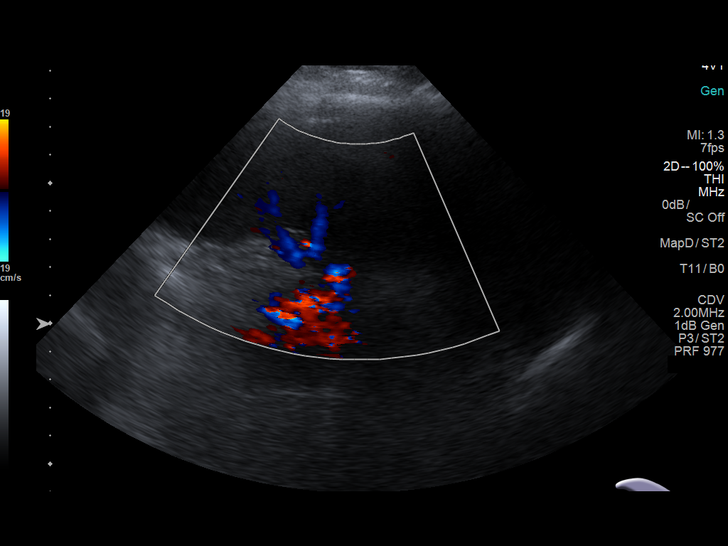
[im 63/101]
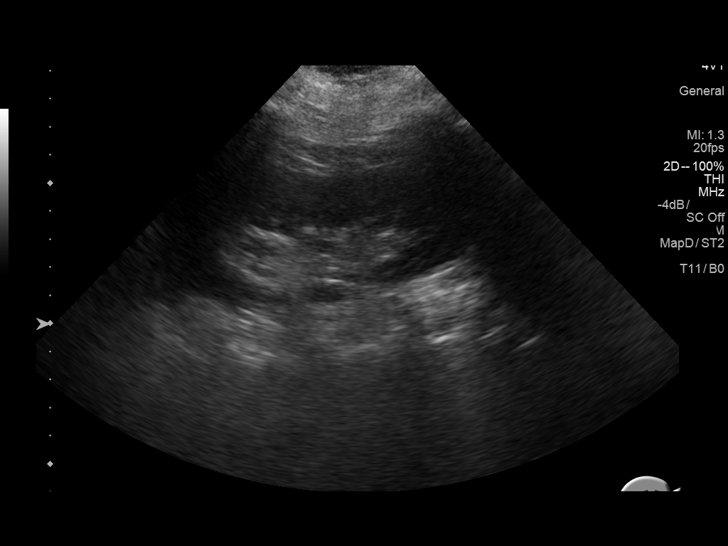
[im 67/101]
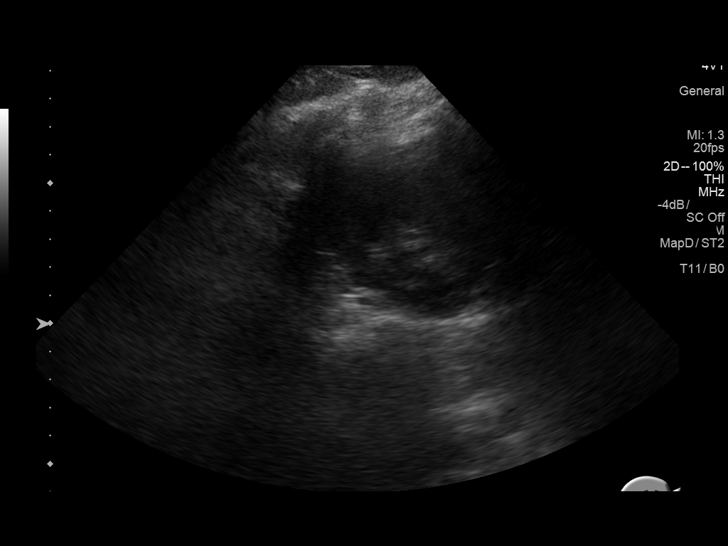
[im 76/101]
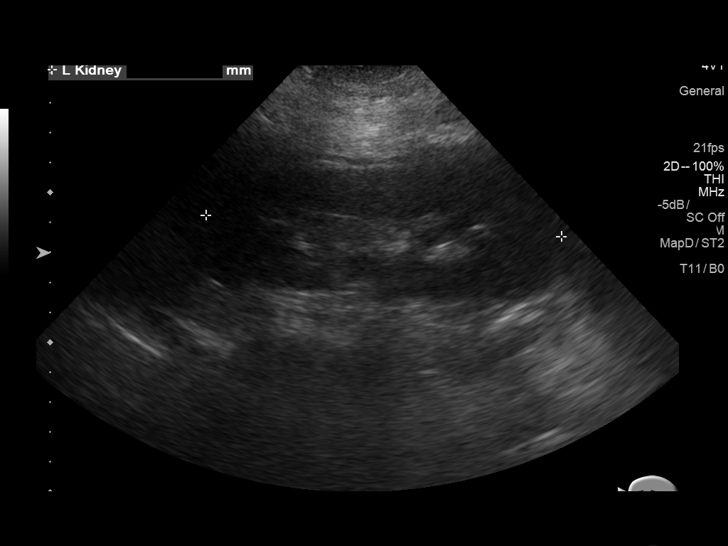
[im 84/101]
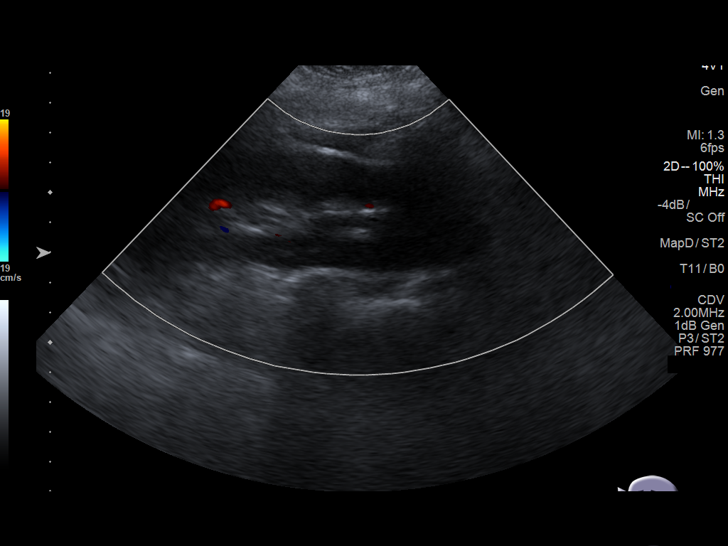
[im 92/101]
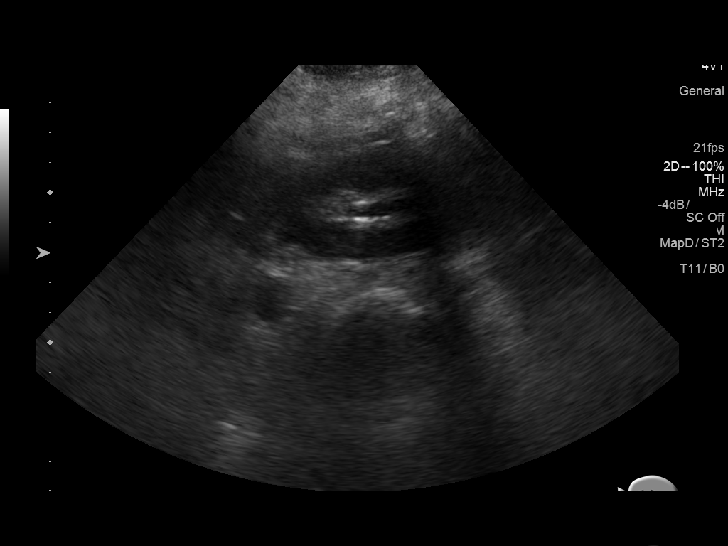
[im 101/101]
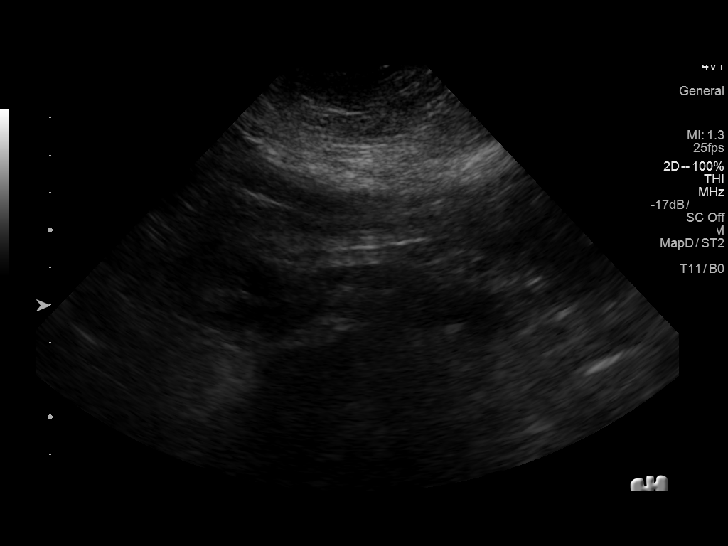

[14 of 25 positions shown; findings below may reference images not displayed]

FINDINGS: Gallbladder: No gallstones or wall thickening visualized. No
sonographic Murphy sign noted by sonographer.

Common bile duct: Diameter: 6.2 mm

Liver: There is echogenic consistent fatty infiltration and/or
hepatocellular disease. No focal hepatic abnormality identified.

IVC: No abnormality visualized.

Pancreas: Visualized portion unremarkable.

Spleen: 16.1 cm.  Volume of 73 cc.

Right Kidney: Length: 11.7 cm.  11.9 cm

Left Kidney: Length: 11.9 cm. Echogenicity within normal limits. No
mass or hydronephrosis visualized. Nonobstructing 6 mm left renal
calculus noted.

Abdominal aorta: No aneurysm visualized.

Other findings: None.
IMPRESSION: 1. The liver is echogenic consistent with fatty infiltration and/or
hepatocellular disease.

2.  Nonobstructing 6 mm left renal calculus.

3. Splenomegaly.

## 2024-06-07 ENCOUNTER — Encounter (INDEPENDENT_AMBULATORY_CARE_PROVIDER_SITE_OTHER): Payer: Self-pay | Admitting: *Deleted
